# Patient Record
Sex: Female | Born: 2000 | Race: Black or African American | Hispanic: No | Smoking: Never smoker
Health system: Southern US, Community
[De-identification: ages and names within clinical notes are randomized; demographics above are authoritative.]

## PROBLEM LIST (undated history)

## (undated) DIAGNOSIS — E032 Hypothyroidism due to medicaments and other exogenous substances: Secondary | ICD-10-CM

## (undated) DIAGNOSIS — R6250 Unspecified lack of expected normal physiological development in childhood: Secondary | ICD-10-CM

## (undated) DIAGNOSIS — D696 Thrombocytopenia, unspecified: Secondary | ICD-10-CM

## (undated) DIAGNOSIS — E063 Autoimmune thyroiditis: Secondary | ICD-10-CM

## (undated) DIAGNOSIS — E0500 Thyrotoxicosis with diffuse goiter without thyrotoxic crisis or storm: Secondary | ICD-10-CM

## (undated) DIAGNOSIS — S0291XA Unspecified fracture of skull, initial encounter for closed fracture: Secondary | ICD-10-CM

## (undated) HISTORY — DX: Autoimmune thyroiditis: E06.3

## (undated) HISTORY — DX: Thyrotoxicosis with diffuse goiter without thyrotoxic crisis or storm: E05.00

## (undated) HISTORY — DX: Thrombocytopenia, unspecified: D69.6

## (undated) HISTORY — DX: Hypothyroidism due to medicaments and other exogenous substances: E03.2

## (undated) HISTORY — DX: Unspecified lack of expected normal physiological development in childhood: R62.50

## (undated) HISTORY — DX: Unspecified fracture of skull, initial encounter for closed fracture: S02.91XA

---
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
## 2022-03-19 ENCOUNTER — Encounter (INDEPENDENT_AMBULATORY_CARE_PROVIDER_SITE_OTHER): Payer: Self-pay | Admitting: Pediatric Endocrinology

## 2022-03-19 ENCOUNTER — Ambulatory Visit (INDEPENDENT_AMBULATORY_CARE_PROVIDER_SITE_OTHER): Payer: Medicaid Other | Admitting: Pediatric Endocrinology

## 2022-03-19 VITALS — BP 118/80 | HR 72 | Ht 62.21 in | Wt 134.4 lb

## 2022-03-19 DIAGNOSIS — E890 Postprocedural hypothyroidism: Secondary | ICD-10-CM

## 2022-03-19 DIAGNOSIS — F5082 Avoidant/restrictive food intake disorder: Secondary | ICD-10-CM | POA: Diagnosis not present

## 2022-03-19 NOTE — Patient Instructions (Signed)
?  Take your thyroid medication every day.  ?If you forget to take your medication- take it as soon as you remember that you missed it! ?You CAN take 2 pills together at the same time.  ? ?Eat on a schedule EVERY DAY! It doesn't have to be a lot of food- but you do need to put something into your body.  ? ?Breakfast ?Morning snack ?Lunch ?Afternoon snack ?Dinner ? ?

## 2022-03-19 NOTE — Progress Notes (Signed)
Subjective:  ?Patient Name: Valerie [name] Date of Birth: Dec 07, 2001  MRN: [id number] ? ?Valerie [name]  presents to the office today for follow-up evaluation and management  of her hypothyroidism following thyroidectomy in January 2013 ? ?HISTORY OF PRESENT ILLNESS:  ? ?Valerie [name] is a 21 y.o. AA female . ? ?Valerie [name] was accompanied by her girlfriend ? ?1. Valerie [name] has been followed in this clinic since 03/16/07.  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 At times she was taking up to four 5 mg tablets of methimazole, twice daily. At other times we had to stop the methimazole completely.  Her TSI values ranged from 200-564% of baseline, with normal being less than 120-140. In the spring and summer of 2012 she became hypothyroid for several months and we had to treat her with Synthroid. Her family chose to seek definitive therapy. She had surgery on January 17th 2013 for removal of her thyroid gland at [facility]. Post operatively she did not have any problems with calcium metabolism, no changes in her voice, or trouble swallowing. She did become rapidly and profoundly hypothyroid. She was placed on escalating doses of synthroid trying to get her thyroid hormone levels in range.   ? ?2. The patient's last Pediatric endocrine visit was on 03/19/21. In the interim, she has been generally healthy.    ? ?She has continued on 88 mcg of levothyroxine. She is not super consistent with taking her medication. She is sometimes taking it in the morning and sometimes at night. She has been missing some doses. She feels that she has been doing well with taking it since her last refill. She last had it refilled about 5 days ago. She feels that the longest she has gone without it was 3-4 days. She did not feel any different when she wasn't taking it.  ? ?She is working at [employer]. She is not sure yet what she wants to do after high school.  ? ?Hair is short now so she can't tell if it is falling out.   ?She is always tired ?No issues with constipation or diarrhea ?She is always cold.  ?LMP 3/8. (Just spotting). ? ?She has lost a lot of weight since last year. She reports that she is "just not hungry anymore". She says that when she eats she only eats a few bites and then she doesn't want anymore. Her GF says that it is hard to get her to eat.  ? ?3. Pertinent Review of Systems:  ? ?Constitutional: The patient feels "okay". The patient seems healthy and active.  ?Eyes: Vision seems to be good. There are no recognized eye problems. Wears glasses - left them at home. She doesn't think that she needs them.  ?Neck: There are no recognized problems of the anterior neck.  ?Heart: There are no recognized heart problems. The ability to play and do other physical activities seems normal.  ?Lungs: no asthma or wheezing.  ?Gastrointestinal: Bowel movents seem normal. There are no recognized GI problems. ?Legs: Muscle mass and strength seem normal. The child can play and perform other physical activities without obvious discomfort. No edema is noted.  ?Feet: There are no obvious foot problems. No edema is noted. ?Neurologic: There are no recognized problems with muscle movement and strength, sensation, or coordination. ?GYN: Menarche 12/15 (age 85). She is having some periods and some months she just spots. LMP 3/8 ?Skin: some acne on forehead or eczema- improved ? ?PAST MEDICAL, FAMILY, AND SOCIAL HISTORY ? ?  Past Medical History:  ?Diagnosis Date  ? Graves disease   ? Hypothyroidism, iatrogenic   ? Linear skull fracture (HCC) 12/10/2009  ? Physical growth delay   ? Thrombocytopenia (HCC)   ? Thyroiditis, autoimmune   ? 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 Thyrotoxicosis with diffuse goiter   ? ? ?Family History  ?Problem Relation Age of Onset  ? Thyroid disease Father   ? Diabetes Maternal Grandfather   ? Cancer Neg Hx   ? ? ? ?Current Outpatient Medications:  ?  levothyroxine (SYNTHROID) 88 MCG tablet, TAKE 1 TABLET(88 MCG)  BY MOUTH DAILY, Disp: 30 tablet, Rfl: 11 ?  ibuprofen (ADVIL,MOTRIN) 600 MG tablet, Take 1 tab PO Q6H x 1-2 days then Q6H prn pain (Patient not taking: Reported on 08/27/2018), Disp: 30 tablet, Rfl: 0 ? ?Allergies as of 03/19/2022 - Review Complete 03/19/2022  ?Allergen Reaction Noted  ? Penicillins Swelling 10/29/2011  ? ? ? reports that she has never smoked. She has never used smokeless tobacco. She reports that she does not drink alcohol and does not use drugs. ?Pediatric History  ?Patient Parents  ? Capron,Tiffany (Mother)  ? ?Other Topics Concern  ? Not on file  ?Social History Narrative  ?   ? Lives with parents, 2 brothers and 1 sister. Older brother not living at home.   ? In the 11th grade at the [employer] at [employer]. Grades are good.  ? Enjoys writing and music.  ?   ?   ?   ?   ? ?12th grade at [employer]. Lives with mom and siblings.  ?Working at [employer]  ? ? ?Primary Care Provider: Theadore Nan, MD ? ?ROS: There are no other significant problems involving Valerie [name]'s other body systems. ? ? Objective:  ?Vital Signs: ? ?BP 118/80 (BP Location: Left Arm, Patient Position: Sitting, Cuff Size: Large)   Pulse 72   Ht 5' 2.21" (1.58 m)   Wt 134 lb 6.4 oz (61 kg)   LMP 01/21/2022 (Exact Date)   BMI 24.42 kg/m?  ? Blood pressure percentiles are not available for patients who are 18 years or older. ? ? ?Ht Readings from Last 3 Encounters:  ?03/19/22 5' 2.21" (1.58 m) (21 %, Z= -0.80)*  ?03/19/21 5' 2.6" (1.59 m) (27 %, Z= -0.62)*  ?09/14/20 5' 3.47" (1.612 m) (40 %, Z= -0.26)*  ? ?* Growth percentiles are based on CDC (Girls, 2-20 Years) data.  ? ?Wt Readings from Last 3 Encounters:  ?03/19/22 134 lb 6.4 oz (61 kg) (66 %, Z= 0.42)*  ?03/19/21 167 lb 6.4 oz (75.9 kg) (93 %, Z= 1.47)*  ?09/14/20 171 lb 12.8 oz (77.9 kg) (94 %, Z= 1.59)*  ? ?* Growth percentiles are based on CDC (Girls, 2-20 Years) data.  ? ?HC Readings from Last 3 Encounters:  ?No data found for [facility]  ? ?Body surface area is 1.64 meters  squared. ? ?21 %ile (Z= -0.80) based on CDC (Girls, 2-20 Years) Stature-for-age data based on Stature recorded on 03/19/2022. ?66 %ile (Z= 0.42) based on CDC (Girls, 2-20 Years) weight-for-age data using vitals from 03/19/2022. ?No head circumference on file for this encounter. ? ? ?PHYSICAL EXAM:  ? ?Constitutional: The patient appears healthy and well nourished. The patient's height and weight are normal for age.   Weight is -33 pounds since last visit. Marland Kitchen  ?Head: The head is normocephalic. ?Face: The face appears normal. There are no obvious dysmorphic features. ?Eyes: The eyes appear to be normally formed and spaced. Gaze  is conjugate. There is no obvious arcus or proptosis. Moisture appears normal. ?Ears: The ears are normally placed and appear externally normal. ?Mouth: The oropharynx and tongue appear normal. Dentition appears to be normal for age. Oral moisture is normal. ?Neck: The neck appears to be visibly normal.  ?Lungs: no increased work of breathing ?Heart: regular pulses and peripheral perfusion ?Abdomen: The abdomen appears to be large in size for the patient's age. There is no obvious hepatomegaly, splenomegaly, or other mass effect.  ?Arms: Muscle size and bulk are normal for age. Scarring on left arm.  ?Hands: There is no obvious tremor. Phalangeal and metacarpophalangeal joints are normal. Palmar muscles are normal for age. Palmar skin is normal. Palmar moisture is also normal. ?Legs: Muscles appear normal for age. No edema is present. ?Feet: Feet are normally formed. Dorsalis pedal pulses are normal. ?Neurologic: Strength is normal for age in both the upper and lower extremities. Muscle tone is normal. Sensation to touch is normal in both the legs and feet.   ?Skin: well healed surgical scar anterior neck. Multiple tattoos.  ? ?LAB DATA:  ? ?Office Visit on 03/19/2021  ?Component Date Value Ref Range Status  ? TSH 03/19/2021 11.09 (H)  mIU/L Final  ? Comment:            Reference Range ?. ?            1-19 Years 0.50-4.30 ?Marland Kitchen. ?               Pregnancy Ranges ?           First trimester   0.26-2.66 ?           Second trimester  0.55-2.73 ?           Third trimester   0.43-2.91 ?  ? Free T4 03/19/2021 1.2  0.8 - 1

## 2022-04-02 ENCOUNTER — Ambulatory Visit (INDEPENDENT_AMBULATORY_CARE_PROVIDER_SITE_OTHER): Payer: Self-pay | Admitting: Pediatric Endocrinology

## 2022-04-15 ENCOUNTER — Ambulatory Visit (INDEPENDENT_AMBULATORY_CARE_PROVIDER_SITE_OTHER): Payer: Medicaid Other | Admitting: Pediatric Endocrinology

## 2022-04-15 ENCOUNTER — Encounter (INDEPENDENT_AMBULATORY_CARE_PROVIDER_SITE_OTHER): Payer: Self-pay | Admitting: Pediatric Endocrinology

## 2022-04-15 VITALS — BP 100/70 | HR 64 | Ht 62.6 in | Wt 134.6 lb

## 2022-04-15 DIAGNOSIS — E890 Postprocedural hypothyroidism: Secondary | ICD-10-CM | POA: Diagnosis not present

## 2022-04-15 MED ORDER — NORETHINDRONE ACETATE 5 MG PO TABS: 5.0000 mg | ORAL_TABLET | Freq: Every day | ORAL | 3 refills | Status: DC

## 2022-04-15 NOTE — Patient Instructions (Signed)
?  Take your thyroid medication every day.  ?If you forget to take your medication- take it as soon as you remember that you missed it! ?You CAN take 2 pills together at the same time.  ? ?Eat on a schedule EVERY DAY! It doesn't have to be a lot of food- but you do need to put something into your body.  ? ?Breakfast ?Morning snack ?Lunch ?Afternoon snack ?Dinner ? ? ?Start Norethindrone 1 tab daily. If you have spotting you can take 2 pills. If you have a period you can take 3 pills. Once the bleeding stops please resume 1 tab daily.  ? ? ?

## 2022-04-15 NOTE — Progress Notes (Signed)
Subjective:  ?Patient Name: Valerie [name] Date of Birth: 2001-08-29  MRN: [id number] ? ?Valerie [name]  presents to the office today for follow-up evaluation and management  of her hypothyroidism following thyroidectomy in January 2013 ? ?HISTORY OF PRESENT ILLNESS:  ? ?Valerie [name] is a 21 y.o. AA female . ? ?Valerie [name] was accompanied by her girlfriend, Valerie [name] ? ?1. Valerie [name] has been followed in this clinic since 03/16/07.  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 At times she was taking up to four 5 mg tablets of methimazole, twice daily. At other times we had to stop the methimazole completely.  Her TSI values ranged from 200-564% of baseline, with normal being less than 120-140. In the spring and summer of 2012 she became hypothyroid for several months and we had to treat her with Synthroid. Her family chose to seek definitive therapy. She had surgery on January 17th 2013 [facility]. Post operatively she did not have any problems with calcium metabolism, no changes in her voice, or trouble swallowing. She did become rapidly and profoundly hypothyroid. She was placed on escalating doses of synthroid trying to get her thyroid hormone levels in range.   ? ?2. The patient's last Pediatric endocrine visit was on 03/19/22. In the interim, she has been generally healthy.    ? ?She reports good adherence to her levothyroxine over the past month. She says that she doesn't really feel any different taking it than she did when she wasn't taking it.  ? ?Her GF says that she is still tired all the time.  ?She goes to sleep between 10 and 1. She gets up around 8 am.  ? ?She is not doing any exercise. She says that she likes to think about starting to work out.  ? ?She is working at [employer]. She is not sure yet what she wants to do after high school.  ? ?Hair is short - currently with significant hair extensions ?She is always tired ?No issues with constipation or  diarrhea ?She is still sometimes cold. Her GF says that she is always cold.  ?LMP 4/4. She has a lot of vomiting with her period. She vomiting 3 times with this past cycle.  ? ?She feels that her appetit is variable. She is trying to eat more times per day.  ?Valerie [name] says that she is eating more but she is still having to force her. She is still struggling with eating a full meal.  ? ? ?3. Pertinent Review of Systems:  ? ?Constitutional: The patient feels "okay". The patient seems healthy and active.  ?Eyes: Vision seems to be good. There are no recognized eye problems. Wears glasses - left them at home. She doesn't think that she needs them.  ?Neck: There are no recognized problems of the anterior neck.  ?Heart: There are no recognized heart problems. The ability to play and do other physical activities seems normal.  ?Lungs: no asthma or wheezing.  ?Gastrointestinal: Bowel movents seem normal. There are no recognized GI problems. ?Legs: Muscle mass and strength seem normal. The child can play and perform other physical activities without obvious discomfort. No edema is noted.  ?Feet: There are no obvious foot problems. No edema is noted. ?Neurologic: There are no recognized problems with muscle movement and strength, sensation, or coordination. ?GYN: Menarche 12/15 (age 21). LMP- started today.  ?Skin: some acne on forehead or eczema- improved ? ?PAST MEDICAL, FAMILY, AND SOCIAL HISTORY ? ?  Past Medical History:  ?Diagnosis Date  ? Graves disease   ? Hypothyroidism, iatrogenic   ? Linear skull fracture (HCC) 12/10/2009  ? Physical growth delay   ? Thrombocytopenia (HCC)   ? Thyroiditis, autoimmune   ? 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 Thyrotoxicosis with diffuse goiter   ? ? ?Family History  ?Problem Relation Age of Onset  ? Thyroid disease Father   ? Diabetes Maternal Grandfather   ? Cancer Neg Hx   ? ? ? ?Current Outpatient Medications:  ?  levothyroxine (SYNTHROID) 88 MCG tablet, TAKE 1 TABLET(88 MCG) BY MOUTH  DAILY, Disp: 30 tablet, Rfl: 11 ?  norethindrone (AYGESTIN) 5 MG tablet, Take 1 tablet (5 mg total) by mouth daily., Disp: 90 tablet, Rfl: 3 ?  ibuprofen (ADVIL,MOTRIN) 600 MG tablet, Take 1 tab PO Q6H x 1-2 days then Q6H prn pain (Patient not taking: Reported on 08/27/2018), Disp: 30 tablet, Rfl: 0 ? ?Allergies as of 04/15/2022 - Review Complete 04/15/2022  ?Allergen Reaction Noted  ? Penicillins Swelling 10/29/2011  ? ? ? reports that she has never smoked. She has never used smokeless tobacco. She reports that she does not drink alcohol and does not use drugs. ?Pediatric History  ?Patient Parents  ? [name],Valerie (Mother)  ? ?Other Topics Concern  ? Not on file  ?Social History Narrative  ?   ? Lives with parents, 2 brothers and 1 sister. Older brother not living at home.   ? In the 11th grade at the [employer]. Grades are good.  ? Enjoys writing and music.  ?   ?   ?   ?   ? ?12th grade at [employer]. Lives with mom and siblings.  ?Working at [employer]  ? ? ?Primary Care Provider: Theadore Nan, MD ? ?ROS: There are no other significant problems involving Legaci's other body systems. ? ? Objective:  ?Vital Signs: ? ?BP 100/70 (BP Location: Left Arm, Patient Position: Sitting, Cuff Size: Large)   Pulse 64   Ht 5' 2.6" (1.59 m)   Wt 134 lb 9.6 oz (61.1 kg)   LMP 04/15/2022 (Exact Date)   BMI 24.15 kg/m?  ? Blood pressure percentiles are not available for patients who are 18 years or older. ? ? ?Ht Readings from Last 3 Encounters:  ?04/15/22 5' 2.6" (1.59 m) (26 %, Z= -0.65)*  ?03/19/22 5' 2.21" (1.58 m) (21 %, Z= -0.80)*  ?03/19/21 5' 2.6" (1.59 m) (27 %, Z= -0.62)*  ? ?* Growth percentiles are based on CDC (Girls, 2-20 Years) data.  ? ?Wt Readings from Last 3 Encounters:  ?04/15/22 134 lb 9.6 oz (61.1 kg) (66 %, Z= 0.42)*  ?03/19/22 134 lb 6.4 oz (61 kg) (66 %, Z= 0.42)*  ?03/19/21 167 lb 6.4 oz (75.9 kg) (93 %, Z= 1.47)*  ? ?* Growth percentiles are based on CDC (Girls, 2-20 Years) data.  ? ?HC  Readings from Last 3 Encounters:  ?No data found for [facility]  ? ?Body surface area is 1.64 meters squared. ? ?26 %ile (Z= -0.65) based on CDC (Girls, 2-20 Years) Stature-for-age data based on Stature recorded on 04/15/2022. ?66 %ile (Z= 0.42) based on CDC (Girls, 2-20 Years) weight-for-age data using vitals from 04/15/2022. ?No head circumference on file for this encounter. ? ? ?PHYSICAL EXAM:  ? ?Constitutional: The patient appears healthy and well nourished. The patient's height and weight are normal for age.   Weight is stable since last visit. Marland Kitchen  ?Head: The head is normocephalic. ?Face: The  face appears normal. There are no obvious dysmorphic features. ?Eyes: The eyes appear to be normally formed and spaced. Gaze is conjugate. There is no obvious arcus or proptosis. Moisture appears normal. ?Ears: The ears are normally placed and appear externally normal. ?Mouth: The oropharynx and tongue appear normal. Dentition appears to be normal for age. Oral moisture is normal. ?Neck: The neck appears to be visibly normal.  ?Lungs: no increased work of breathing ?Heart: regular pulses and peripheral perfusion ?Abdomen: The abdomen appears to be large in size for the patient's age. There is no obvious hepatomegaly, splenomegaly, or other mass effect.  ?Arms: Muscle size and bulk are normal for age. Scarring on left arm.  ?Hands: There is no obvious tremor. Phalangeal and metacarpophalangeal joints are normal. Palmar muscles are normal for age. Palmar skin is normal. Palmar moisture is also normal. ?Legs: Muscles appear normal for age. No edema is present. ?Feet: Feet are normally formed. Dorsalis pedal pulses are normal. ?Neurologic: Strength is normal for age in both the upper and lower extremities. Muscle tone is normal. Sensation to touch is normal in both the legs and feet.   ?Skin: well healed surgical scar anterior neck. Multiple tattoos.  ? ?LAB DATA:   ? ?Office Visit on 03/19/2021  ?Component Date Value Ref Range Status  ?  TSH 03/19/2021 11.09 (H)  mIU/L Final  ? Comment:            Reference Range ?. ?           1-19 Years 0.50-4.30 ?Marland Kitchen ?               Pregnancy Ranges ?           First trimester   0.26-2.66 ?           [place]

## 2022-04-16 LAB — T4, FREE: Free T4: 1 ng/dL (ref 0.8–1.4)

## 2022-04-16 LAB — TSH: TSH: 9.38 mIU/L — ABNORMAL HIGH

## 2022-04-29 ENCOUNTER — Ambulatory Visit (INDEPENDENT_AMBULATORY_CARE_PROVIDER_SITE_OTHER): Payer: Medicaid Other | Admitting: Pediatric Endocrinology

## 2022-05-16 ENCOUNTER — Ambulatory Visit (INDEPENDENT_AMBULATORY_CARE_PROVIDER_SITE_OTHER): Payer: Medicaid Other | Admitting: Pediatric Endocrinology

## 2022-05-16 ENCOUNTER — Encounter (INDEPENDENT_AMBULATORY_CARE_PROVIDER_SITE_OTHER): Payer: Self-pay | Admitting: Pediatric Endocrinology

## 2022-05-16 VITALS — BP 118/80 | HR 64 | Ht 62.95 in | Wt 127.8 lb

## 2022-05-16 DIAGNOSIS — R634 Abnormal weight loss: Secondary | ICD-10-CM

## 2022-05-16 DIAGNOSIS — E890 Postprocedural hypothyroidism: Secondary | ICD-10-CM

## 2022-05-16 LAB — CBC WITH DIFFERENTIAL/PLATELET
Absolute Monocytes: 281 cells/uL (ref 200–900)
Basophils Absolute: 9 cells/uL (ref 0–200)
HCT: 30.6 % — ABNORMAL LOW (ref 34.0–46.0)
Hemoglobin: 8.9 g/dL — ABNORMAL LOW (ref 11.5–15.3)
Neutro Abs: 1215 cells/uL — ABNORMAL LOW (ref 1800–8000)

## 2022-05-16 NOTE — Progress Notes (Signed)
Subjective:  Patient Name: Valerie [name] Date of Birth: 08/10/2001  MRN: [id number]  Valerie [name]  presents to the office today for follow-up evaluation and management  of her hypothyroidism following thyroidectomy in January 2013  HISTORY OF PRESENT ILLNESS:   Valerie [name] is a 21 y.o. AA female .  Valerie [name] was accompanied by her girlfriend, Valerie [name]   1. Valerie [name] has been followed in this clinic since 03/16/07.  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 At times she was taking up to four 5 mg tablets of methimazole, twice daily. At other times we had to stop the methimazole completely.  Her TSI values ranged from 200-564% of baseline, with normal being less than 120-140. In the spring and summer of 2012 she became hypothyroid for several months and we had to treat her with Synthroid. Her family chose to seek definitive therapy. She had surgery on January 17th 2013 for removal of her thyroid gland at [facility]. Post operatively she did not have any problems with calcium metabolism, no changes in her voice, or trouble swallowing. She did become rapidly and profoundly hypothyroid. She was placed on escalating doses of synthroid trying to get her thyroid hormone levels in range.    2. The patient's last Pediatric endocrine visit was on 04/15/22. In the interim, she has been generally healthy.     She has continued to do well with taking her medication.   She continues to be sleepy all the time.   She is not exercising.   She is working at [employer]. She has graduated HS.    Hair is short - currently with significant hair extensions- no issues She is always tired No issues with constipation or diarrhea She is no longer always cold.  LMP 5/25. She had 1 episode of vomiting earlier in the month but none with her period.   She is still not that hungry and doesn't eat that much. Valerie [name] says that she is still having to make her eat. She is still struggling with eating a  full meal. She usually eats about 5 bites.    3. Pertinent Review of Systems:   Constitutional: The patient feels "fine". The patient seems healthy and active.  Eyes: Vision seems to be good. There are no recognized eye problems. Wears glasses - left them at home. She doesn't think that she needs them.  Neck: There are no recognized problems of the anterior neck.  Heart: There are no recognized heart problems. The ability to play and do other physical activities seems normal.  Lungs: no asthma or wheezing.  Gastrointestinal: Bowel movents seem normal. There are no recognized GI problems. Legs: Muscle mass and strength seem normal. The child can play and perform other physical activities without obvious discomfort. No edema is noted.  Feet: There are no obvious foot problems. No edema is noted. Neurologic: There are no recognized problems with muscle movement and strength, sensation, or coordination. GYN: Menarche 12/15 (age 31). 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 (Patient not taking: Reported on 05/16/2022), Disp: 90 tablet, Rfl:  3  Allergies as of 05/16/2022 - Review Complete 05/16/2022  Allergen Reaction Noted   Penicillins Swelling 10/29/2011     reports that she has never smoked. She has never used smokeless tobacco. She reports that she does not drink alcohol and does not use drugs. Pediatric History  Patient Parents   Wherley,Tiffany (Mother)   Other Topics Concern   Not on file  Social History Narrative      Lives with parents, 2 brothers and 1 sister. Older brother not living at home.    In the 11th grade at the [employer]. Grades are good.   Enjoys writing and music.               Graduated [employer]. Lives with mom and siblings.  Working at [employer]    Primary Care Provider: Theadore Nan, MD  ROS: There are no other significant problems involving Lezli's other body systems.   Objective:  Vital Signs:   BP 118/80 (BP Location: Left Arm, Patient Position: Sitting, Cuff Size: Large)   Pulse 64   Ht 5' 2.95" (1.599 m)   Wt 127 lb 12.8 oz (58 kg)   LMP 05/09/2022   BMI 22.67 kg/m   Blood pressure percentiles are not available for patients who are 18 years or older.   Ht Readings from Last 3 Encounters:  05/16/22 5' 2.95" (1.599 m) (30 %, Z= -0.51)*  04/15/22 5' 2.6" (1.59 m) (26 %, Z= -0.65)*  03/19/22 5' 2.21" (1.58 m) (21 %, Z= -0.80)*   * Growth percentiles are based on CDC (Girls, 2-20 Years) data.   Wt Readings from Last 3 Encounters:  05/16/22 127 lb 12.8 oz (58 kg) (55 %, Z= 0.12)*  04/15/22 134 lb 9.6 oz (61.1 kg) (66 %, Z= 0.42)*  03/19/22 134 lb 6.4 oz (61 kg) (66 %, Z= 0.42)*   * Growth percentiles are based on CDC (Girls, 2-20 Years) data.   HC Readings from Last 3 Encounters:  No data found for [facility]   Body surface area is 1.61 meters squared.  30 %ile (Z= -0.51) based on CDC (Girls, 2-20 Years) Stature-for-age data based on Stature recorded on 05/16/2022. 55 %ile (Z= 0.12) based on CDC (Girls, 2-20 Years) weight-for-age data using vitals from  05/16/2022. No head circumference on file for this encounter.   PHYSICAL EXAM:   Constitutional: The patient appears healthy and well nourished. The patient's height and weight are normal for age.   Weight is stable since last visit. Marland Kitchen  Head: The head is normocephalic. Face: The face appears normal. There are no obvious dysmorphic features. Eyes: The eyes appear to be normally formed and spaced. Gaze is conjugate. There is no obvious arcus or proptosis. Moisture appears normal. Ears: The ears are normally placed and appear externally normal. Mouth: The oropharynx and tongue appear normal. Dentition appears to be normal for age. Oral moisture is normal. Neck: The neck appears to be visibly normal.  Lungs: no increased work of breathing Heart: regular pulses and peripheral perfusion Abdomen: The abdomen appears to be large in size for the patient's age. There is no obvious hepatomegaly, splenomegaly, or other mass effect.  Arms: Muscle size and bulk are normal for age. Scarring on left arm.  Hands: There is no obvious tremor. Phalangeal and metacarpophalangeal joints are normal. Palmar muscles are normal for age. Palmar skin is normal. Palmar moisture is also normal. Legs: Muscles appear normal for age. No edema is present. Feet: Feet are normally formed. Dorsalis pedal pulses are normal. Neurologic: Strength is normal for age in both the upper and lower extremities. Muscle tone is normal. Sensation to touch is normal in both the legs and feet.   Skin: well healed surgical scar anterior neck. Multiple tattoos.   LAB DATA:    Office Visit on 04/15/2022  Component Date Value Ref Range Status   TSH 04/15/2022 9.38 (H)  mIU/L Final   Comment:            Reference Range .            1-19 Years 0.50-4.30 .                Pregnancy Ranges            First trimester   0.26-2.66            Second trimester  0.55-2.73            Third trimester   0.43-2.91    Free T4 04/15/2022 1.0  0.8 - 1.4  ng/dL Final    Lab Results  Component Value Date   TSH 9.38 (H) 04/15/2022   TSH 11.09 (H) 03/19/2021   TSH 5.45 (H) 09/14/2020   TSH 0.53 06/12/2020   TSH 0.24 (L) 03/09/2020   TSH 1.54 07/05/2019   Lab Results  Component Value Date   FREET4 1.0 04/15/2022   FREET4 1.2 03/19/2021   FREET4 1.1 09/14/2020   FREET4 1.6 (H) 06/12/2020   FREET4 1.5 (H) 03/09/2020   FREET4 1.1 07/05/2019    Pending    Assessment and Plan:   ASSESSMENT: Deneshia is a 21 y.o. AA female with history of difficult to manage Grave's Disease/hyperthyroidism treated with thyroidectomy at age 23 for definitive therapy.   Post Surgical Hypothyroid - clinically euthyroid - 88 mcg of Synthroid daily - She reports good adherence for the past month  Significant weight loss - She has lost another 7 pounds since her last visit. This is a total of ~25% body weight loss in the past 13 months.  - appetite is still minimal  PLAN:    1. Diagnostic:  Lab Orders         Comprehensive metabolic panel         Magnesium         CBC with Differential/Platelet         VITAMIN D 25 Hydroxy (Vit-D Deficiency, Fractures)         Phosphorus         Fe+TIBC+Fer         TSH         T4, free      .  2. Therapeutic: Continue Synthroid 88 mcg daily  3. Patient education:discussed strategies for keeping prescriptions up to date. Reviewed need for 5 small meals per day - even if she is not hungry.  Referral placed to adolescent medicine due to ongoing weight loss and concern for ED 4.  Follow-up: Return in about 2 months (around 07/16/2022).    Dessa PhiJennifer Avishai Reihl, MD  Level of Service: >30 minutes spent today reviewing the medical chart, counseling the patient/family, and documenting today's encounter.

## 2022-05-16 NOTE — Patient Instructions (Signed)
What is hypothyroidism?  Hypothyroidism refers to an underactive thyroid gland that does not  produce enough of the active thyroid hormones triiodothyronine (T3) and levothyroxine (T4). This condition can be present at birth or acquired anytime during childhood or adulthood. Hypothyroidism is very common and occurs in about 1 in 1,250 children. In most cases, the condition is permanent and will require treatment for life. This handout focuses on the causes of hypothyroidism in children that arise after birth.The thyroid gland is a butterfly-shaped organ located in the middle  of the neck. It is responsible for producing thyroid hormones T3 and T4. This production is controlled by the pituitary gland in the brain via thyroid-stimulating hormone (TSH). T3 and T4 perform many important  actions during childhood, including the maintenance of normal growth and bone development. Thyroid hormone is also important in the regulation of metabolism. What causes acquired hypothyroidism?  The causes of hypothyroidism can arise from the gland itself or from the pituitary. The thyroid can be damaged by direct antibody attack (autoimmunity), radiation, or surgery. The pituitary gland can be damaged following a severe brain injury or secondary to radiation treatment. Certain medications and substances can interfere with thyroid hormone production. For example, too much or too little iodine in the diet can lead to hypothyroidism. Overall, the most common cause of hypothyroidism in children and teens is direct attack of the thyroid gland from the immune system. This disease is known as autoimmune thyroiditis or Hashimoto disease. Certain children are at greater risk of hypothyroidism, including those with congenital syndromes, especially Down  syndrome and Turner syndrome; those with type 1 diabetes; and those who have received radiation for cancer treatment.  What are the signs and symptoms of hypothyroidism?  The signs  and symptoms of hypothyroidism include:  Tiredness  Modest weight gain (no more than 5-10 lb)  Feeling cold  Dry skin  Hair loss  Constipation  Poor growth  Often, your child's doctor will be able to palpate an enlarged thyroid  gland in the neck. This is called a goiter. How is hypothyroidism diagnosed?  Simple blood tests are used to diagnose hypothyroidism. These include  the measurement of hormones produced by the thyroid and pituitary  glands. Free T4, total T4, and TSH levels are usually measured. These tests are inexpensive and widely available at your regular doctor's office.Primary hypothyroidism is diagnosed when the level of stimulating hormone from the pituitary gland (TSH) in the blood is high and the free T4 level produced by the thyroid is low. Secondary hypothyroidism occurs if  there is not enough TSH, both levels will be low. Normal ranges for free T4 and TSH are somewhat different in children  than adults, so the diagnosis should be made in consultation with a pediatric endocrinologist.  How is hypothyroidism treated?  Hypothyroidism is treated using a synthetic thyroid hormone called levothyroxine. This is a once-daily pill that is usually given for life (for more information on thyroid hormone, see the Thyroid Hormone Administration: A Guide for Families handout). There are very few side effects, and when they do occur, it is usually the result of significant  overtreatment. Your child's doctor will prescribe the medication and then perform repeat blood testing. The repeat blood testing will not happen for at least 6 to 8 weeks because it takes time for the body to adjust to its new hormone  levels. If the medication is working, blood testing will show normal levels of TSH and free T4. The dose of the   medication is adjusted by regular monitoring of thyroid function laboratory tests. You should contact your child's doctor if your child experiences difficulty  falling  asleep, restless sleep, or difficulty concentrating in school. These may be signs that your child's current thyroid hormone dose may be too high and your child is being overtreated.There is no cure for hypothyroidism; however, hormone replacement  is safe and effective. With once-daily medication and close follow-up with your pediatric endocrinologist, your child can live a normal,  healthy life.   Pediatric Endocrinology Fact Sheet Acquired Hypothyroidism in Children: A Guide for Families Copyright  2018 American Academy of Pediatrics and Pediatric Endocrine Society. All rights reserved. The information contained in this publication should not be used as a substitute for the medical care and advice of your pediatrician. There may be variations in treatment that your pediatrician may recommend based on individual facts and circumstances.Pediatric Endocrine Society/American Academy of Pediatrics Section on Endocrinology Patient Education Committee  

## 2022-05-17 LAB — COMPREHENSIVE METABOLIC PANEL
AG Ratio: 1.6 (calc) (ref 1.0–2.5)
ALT: 5 U/L (ref 5–32)
AST: 11 U/L — ABNORMAL LOW (ref 12–32)
Albumin: 4.6 g/dL (ref 3.6–5.1)
Alkaline phosphatase (APISO): 26 U/L — ABNORMAL LOW (ref 36–128)
BUN: 10 mg/dL (ref 7–20)
CO2: 25 mmol/L (ref 20–32)
Calcium: 9.2 mg/dL (ref 8.9–10.4)
Chloride: 106 mmol/L (ref 98–110)
Creat: 0.62 mg/dL (ref 0.50–0.96)
Globulin: 2.8 g/dL (calc) (ref 2.0–3.8)
Glucose, Bld: 87 mg/dL (ref 65–99)
Potassium: 4.3 mmol/L (ref 3.8–5.1)
Sodium: 139 mmol/L (ref 135–146)
Total Bilirubin: 0.3 mg/dL (ref 0.2–1.1)
Total Protein: 7.4 g/dL (ref 6.3–8.2)

## 2022-05-17 LAB — CBC WITH DIFFERENTIAL/PLATELET
Basophils Relative: 0.3 %
Eosinophils Absolute: 0 cells/uL — ABNORMAL LOW (ref 15–500)
Eosinophils Relative: 0 %
Lymphs Abs: 1395 cells/uL (ref 1200–5200)
MCH: 22.1 pg — ABNORMAL LOW (ref 25.0–35.0)
MCHC: 29.1 g/dL — ABNORMAL LOW (ref 31.0–36.0)
MCV: 76.1 fL — ABNORMAL LOW (ref 78.0–98.0)
MPV: 10.4 fL (ref 7.5–12.5)
Monocytes Relative: 9.7 %
Neutrophils Relative %: 41.9 %
Platelets: 247 10*3/uL (ref 140–400)
RBC: 4.02 10*6/uL (ref 3.80–5.10)
RDW: 18.5 % — ABNORMAL HIGH (ref 11.0–15.0)
Total Lymphocyte: 48.1 %
WBC: 2.9 10*3/uL — ABNORMAL LOW (ref 4.5–13.0)

## 2022-05-17 LAB — MAGNESIUM: Magnesium: 2 mg/dL (ref 1.5–2.5)

## 2022-05-17 LAB — IRON,TIBC AND FERRITIN PANEL
%SAT: 3 % (calc) — ABNORMAL LOW (ref 15–45)
Ferritin: 1 ng/mL — ABNORMAL LOW (ref 6–67)
Iron: 13 ug/dL — ABNORMAL LOW (ref 27–164)
TIBC: 443 mcg/dL (calc) (ref 271–448)

## 2022-05-17 LAB — PHOSPHORUS: Phosphorus: 4.4 mg/dL (ref 3.0–5.1)

## 2022-05-17 LAB — T4, FREE: Free T4: 1.3 ng/dL (ref 0.8–1.4)

## 2022-05-17 LAB — TSH: TSH: 11.18 mIU/L — ABNORMAL HIGH

## 2022-05-17 LAB — VITAMIN D 25 HYDROXY (VIT D DEFICIENCY, FRACTURES): Vit D, 25-Hydroxy: 16 ng/mL — ABNORMAL LOW (ref 30–100)

## 2022-05-30 ENCOUNTER — Other Ambulatory Visit (INDEPENDENT_AMBULATORY_CARE_PROVIDER_SITE_OTHER): Payer: Self-pay | Admitting: Pediatric Endocrinology

## 2022-05-30 DIAGNOSIS — F5082 Avoidant/restrictive food intake disorder: Secondary | ICD-10-CM

## 2022-05-30 DIAGNOSIS — R790 Abnormal level of blood mineral: Secondary | ICD-10-CM

## 2022-05-30 DIAGNOSIS — D709 Neutropenia, unspecified: Secondary | ICD-10-CM

## 2022-05-30 DIAGNOSIS — D508 Other iron deficiency anemias: Secondary | ICD-10-CM

## 2022-06-06 ENCOUNTER — Encounter: Payer: Self-pay | Admitting: Family

## 2022-06-06 ENCOUNTER — Encounter: Payer: Medicaid Other | Admitting: Licensed Clinical Social Worker

## 2022-06-06 ENCOUNTER — Ambulatory Visit (INDEPENDENT_AMBULATORY_CARE_PROVIDER_SITE_OTHER): Payer: Medicaid Other | Admitting: Family

## 2022-06-06 VITALS — BP 115/77 | HR 73 | Ht 62.21 in | Wt 128.6 lb

## 2022-06-06 DIAGNOSIS — N946 Dysmenorrhea, unspecified: Secondary | ICD-10-CM | POA: Diagnosis not present

## 2022-06-06 DIAGNOSIS — R634 Abnormal weight loss: Secondary | ICD-10-CM | POA: Diagnosis not present

## 2022-06-06 DIAGNOSIS — Z113 Encounter for screening for infections with a predominantly sexual mode of transmission: Secondary | ICD-10-CM | POA: Diagnosis not present

## 2022-06-06 DIAGNOSIS — F4321 Adjustment disorder with depressed mood: Secondary | ICD-10-CM

## 2022-06-06 DIAGNOSIS — Z3202 Encounter for pregnancy test, result negative: Secondary | ICD-10-CM

## 2022-06-06 LAB — POCT URINE PREGNANCY: Preg Test, Ur: NEGATIVE

## 2022-06-06 MED ORDER — NORETHINDRONE ACETATE 5 MG PO TABS: 5.0000 mg | ORAL_TABLET | Freq: Every day | ORAL | 0 refills | Status: AC

## 2022-06-06 NOTE — Progress Notes (Addendum)
THIS RECORD MAY CONTAIN CONFIDENTIAL INFORMATION THAT SHOULD NOT BE RELEASED WITHOUT REVIEW OF THE SERVICE PROVIDER.  Adolescent Medicine Consultation Initial Visit Valerie [name]  is a 21 y.o. female referred by Valerie [name] here today for evaluation of weight loss.  Supervising Physician: Valerie [name]    Review of records?  yes  Pertinent Labs? No  Growth Chart Viewed? yes   History was provided by the patient.   Chief complaint: Weight loss  HPI:   PCP Confirmed?  yes   Referred by: Valerie [name]  - Patient reports unintentional weight loss over the past year.  - She says that she did not notice she was losing weight but friends and family have commented on it - She states it is due to not feeling like eating - She eats 1-2 meals per day. They are typically small meals. If she eats more she either vomits or has bad stomach pains - Yesterday she ate a breakfast croissant and a baked potato and felt full. - Denies any purging or intentional weight loss. She is unsure if she wants to go back to eating normally though. - When she's very stressed she does not eat but never skips days without eating anything - Prior to this she reports eating a lot, typically 3 meals - She thinks it all began at the beginning of her senior year (just graduated) when she entered relationship with two other women - She states she has had a lot of stress over the past year and that relationship was a large part of it - She thinks her stress is causing her to not be able to eat - She now is in a relationship with only one of the women and states that its not a good relationship. The other person blames her for a lot of things.  - She was skipping school and having a lot of difficulties and was not sure she would graduate. She regained her focus and successfully graduated - She now works at [employer]   - She has been feeling sad and down recently but denies anxious feeling - Does not have a support system to  talk to - Spends a lot of time by herself in her room - Denies SI/HI - No therapist and not on medications in the past   - When on period if eats she vomits and has bad stomach pains - Valerie [name] prescribed birth control for painful periods but went to wrong pharmacy so she has not started.  - Uses about 5 pads and 2 tampons per day. Some clots. Bleeds for 7 days. - LMP May 25  - Taking synthroid regularly - TSH 11.18 at endo visit on 6/1   Patient's last menstrual period was 05/09/2022.  Allergies  Allergen Reactions   Penicillins Swelling   Current Outpatient Medications on File Prior to Visit  Medication Sig Dispense Refill   levothyroxine (SYNTHROID) 88 MCG tablet TAKE 1 TABLET(88 MCG) BY MOUTH DAILY 30 tablet 11   ibuprofen (ADVIL,MOTRIN) 600 MG tablet Take 1 tab PO Q6H x 1-2 days then Q6H prn pain (Patient not taking: Reported on 08/27/2018) 30 tablet 0   norethindrone (AYGESTIN) 5 MG tablet Take 1 tablet (5 mg total) by mouth daily. (Patient not taking: Reported on 05/16/2022) 90 tablet 3   No current facility-administered medications on file prior to visit.    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 yes Family History  Problem Relation Age of Onset   Thyroid disease Father     Diabetes Maternal Grandfather    Cancer Neg Hx     Social History:  School:  School: just graduated 12th grade Difficulties at school:  yes, almost did not graduate but did  Lifestyle habits that can impact QOL: Sleep: sleeps poorly, same dreams over and over again  Confidentiality was discussed with the patient and if applicable, with caregiver as well.  Gender identity: Female Sex assigned at birth: Female Pronouns: she Tobacco?  no Partner preference?  female  Sexually Active?  yes  The following portions of the patient's history were reviewed and updated as appropriate: allergies, current medications, past family history, past medical history, past social history, past surgical history, and problem list.      06/06/2022   10:42 AM  PHQ-SADS Last 3 Score only  PHQ-15 Score 5  Total GAD-7 Score 4  PHQ Adolescent Score 13   EAT 26 Completed on 06/06/22 Total Score: 6 Patient report of Weight: Highest: Blank  Lowest: 128 Ideal: blank Binge: No Purge: No Over-Exercise: No  Physical Exam:  Vitals:   06/06/22 0926 06/06/22 0937  BP: 117/78 115/77  Pulse: 65 73  Weight: 128 lb 9.6 oz (58.3 kg)   Height: 5' 2.21" (1.58 m)    BP 115/77 (BP Location: Right Arm, Cuff Size: Normal)   Pulse 73   Ht 5' 2.21" (1.58 m)   Wt 128 lb 9.6 oz (58.3 kg)   LMP 05/09/2022   BMI 23.37 kg/m  Body mass index: body mass index is 23.37 kg/m. Blood pressure %iles are not available for patients who are 18 years or older.   Physical Exam Constitutional:      General: She is not in acute distress.    Appearance: She is well-developed.     Comments: Tearful on exam  HENT:     Head: Normocephalic and atraumatic.  Eyes:     General: No scleral icterus.    Pupils: Pupils are equal, round, and reactive to light.  Neck:     Thyroid: No thyromegaly.  Cardiovascular:     Rate and Rhythm: Normal rate and regular rhythm.     Heart sounds: Normal heart sounds. No murmur  heard. Pulmonary:     Effort: Pulmonary effort is normal.     Breath sounds: Normal breath sounds.  Abdominal:     Palpations: Abdomen is soft.  Musculoskeletal:        General: Normal range of motion.     Cervical back: Normal range of motion and neck supple.  Lymphadenopathy:     Cervical: No cervical adenopathy.  Skin:    General: Skin is warm and dry.     Findings: No rash.  Neurological:     Mental Status: She is alert and oriented to person, place, and  time.     Cranial Nerves: No cranial nerve deficit.  Psychiatric:        Behavior: Behavior normal.        Thought Content: Thought content normal.        Judgment: Judgment normal.       06/06/2022   10:42 AM  PHQ-SADS Last 3 Score only  PHQ-15 Score 5  Total GAD-7 Score 4  PHQ Adolescent Score 13   EAT 26 Completed on 06/06/22 Total Score: 6 Patient report of Weight: Highest: NA Lowest: NA Ideal: NA Binge: No Purge: No Over-Exercise: No   Assessment/Plan: 1. Adjustment disorder with depressed mood 2. Weight loss Review of growth chart indicates 39 lb weight loss in the past 14 months. No frank body image or eating disorder patterns were identified today, denies intentional weight loss, restricting, or purging. She seems as though her mood greatly effects her appetite. She describes being very stressed and is tearful on exam. Describes having little appetite when she is experiencing stress which she believes led to her weight loss. She is overall well appearing with stable vitals today. She is agreeable to meeting with BH at her next visit and is contemplating medication management for her adjustment disorder. We will discuss SSRI options further at her follow up visit. She requested to follow up in 4 weeks.  3. Dysmenorrhea She has history of painful cramps and vomiting during her menstrual cycles for which endocrinology prescribed OCPs. She has not started these yet because she wanted them sent to a different  pharmacy. Prescription has been sent to preferred pharmacy. She also describes using about 7 pads per day during her menstrual cycle. This is likely somewhat due to her hypothyroidism- TSH 11 at last endocrinology visit. If continuing to have heavy bleeding after starting OCP and stable thyroid levels, may need further lab work. -Rx for Aygestin sent to pharmacy    Follow-up:   one month  A copy of this consultation visit was sent to: Theadore Nan, MD, Theadore Nan, MD   Madison Hickman, MD [facility] Pediatric Resident, PGY-3  Supervising Provider Co-Signature  I reviewed with the resident the medical history and the resident's findings on physical examination.  I discussed with the resident the patient's diagnosis and concur with the treatment plan as documented in the resident's note.  Georges Mouse, NP

## 2022-06-07 LAB — C. TRACHOMATIS/N. GONORRHOEAE RNA
C. trachomatis RNA, TMA: NOT DETECTED
N. gonorrhoeae RNA, TMA: NOT DETECTED

## 2022-06-12 ENCOUNTER — Encounter: Payer: Self-pay | Admitting: Family

## 2022-06-26 NOTE — BH Specialist Note (Unsigned)
Integrated Behavioral Health Initial In-Person Visit  MRN: [id number] Name: Valerie [name]  Number of Integrated Behavioral Health Clinician visits: No data recorded Session Start time: No data recorded   Session End time: No data recorded Total time in minutes: No data recorded  Types of Service: {CHL AMB TYPE OF SERVICE:[phone]}  Interpretor:{yes WK:[id number]} Interpretor Name and Language: ***   Warm Hand Off Completed.        Subjective: Valerie [name] is a 21 y.o. female accompanied by {CHL AMB ACCOMPANIED RP:[id number]} Patient was referred by *** for ***. Patient reports the following symptoms/concerns: *** Duration of problem: ***; Severity of problem: {Mild/Moderate/Severe:20260}  Objective: Mood: {BHH MOOD:22306} and Affect: {BHH AFFECT:22307} Risk of harm to self or others: {CHL AMB BH Suicide Current Mental Status:21022748}  Life Context: Family and Social: *** School/Work: *** Self-Care: *** Life Changes: ***  Patient and/or Family's Strengths/Protective Factors: {CHL AMB BH PROTECTIVE FACTORS:[phone]}  Goals Addressed: Patient will: Reduce symptoms of: {IBH Symptoms:21014056} Increase knowledge and/or ability of: {IBH Patient Tools:21014057}  Demonstrate ability to: {IBH Goals:21014053}  Progress towards Goals: {CHL AMB BH PROGRESS TOWARDS GOALS:[phone]}  Interventions: Interventions utilized: {IBH Interventions:21014054}  Standardized Assessments completed: {IBH Screening Tools:21014051}  Patient and/or Family Response: ***  Patient Centered Plan: Patient is on the following Treatment Plan(s):  ***  Assessment: Patient currently experiencing ***.   Patient may benefit from ***.  Plan: Follow up with behavioral health clinician on : *** Behavioral recommendations: *** Referral(s): {IBH Referrals:21014055} "From scale of 1-10, how likely are you to follow plan?": ***  Carleene Overlie, [facility]

## 2022-06-27 ENCOUNTER — Ambulatory Visit (INDEPENDENT_AMBULATORY_CARE_PROVIDER_SITE_OTHER): Payer: Medicaid Other | Admitting: Licensed Clinical Social Worker

## 2022-06-27 DIAGNOSIS — F4321 Adjustment disorder with depressed mood: Secondary | ICD-10-CM | POA: Diagnosis not present

## 2022-07-04 ENCOUNTER — Ambulatory Visit: Payer: Medicaid Other | Admitting: Family

## 2022-07-04 ENCOUNTER — Ambulatory Visit: Payer: Medicaid Other | Admitting: Licensed Clinical Social Worker

## 2022-07-16 ENCOUNTER — Encounter (INDEPENDENT_AMBULATORY_CARE_PROVIDER_SITE_OTHER): Payer: Self-pay | Admitting: Pediatric Endocrinology

## 2022-07-16 ENCOUNTER — Ambulatory Visit (INDEPENDENT_AMBULATORY_CARE_PROVIDER_SITE_OTHER): Payer: Medicaid Other | Admitting: Pediatric Endocrinology

## 2022-07-16 VITALS — BP 122/78 | HR 76 | Ht 62.6 in | Wt 128.6 lb

## 2022-07-16 DIAGNOSIS — D509 Iron deficiency anemia, unspecified: Secondary | ICD-10-CM | POA: Diagnosis not present

## 2022-07-16 DIAGNOSIS — D709 Neutropenia, unspecified: Secondary | ICD-10-CM

## 2022-07-16 DIAGNOSIS — E063 Autoimmune thyroiditis: Secondary | ICD-10-CM | POA: Diagnosis not present

## 2022-07-16 LAB — CBC WITH DIFFERENTIAL/PLATELET
Absolute Monocytes: 310 cells/uL (ref 200–900)
Basophils Absolute: 11 cells/uL (ref 0–200)
Basophils Relative: 0.3 %
Eosinophils Absolute: 0 cells/uL — ABNORMAL LOW (ref 15–500)
Eosinophils Relative: 0 %
HCT: 28.7 % — ABNORMAL LOW (ref 34.0–46.0)
MCH: 21.2 pg — ABNORMAL LOW (ref 25.0–35.0)
MCHC: 28.9 g/dL — ABNORMAL LOW (ref 31.0–36.0)
MPV: 9.7 fL (ref 7.5–12.5)
Monocytes Relative: 8.6 %
Neutro Abs: 1897 cells/uL (ref 1800–8000)
Platelets: 317 10*3/uL (ref 140–400)
Total Lymphocyte: 38.4 %
WBC: 3.6 10*3/uL — ABNORMAL LOW (ref 4.5–13.0)

## 2022-07-16 MED ORDER — LEVOTHYROXINE SODIUM 88 MCG PO TABS: ORAL_TABLET | ORAL | 3 refills | Status: DC

## 2022-07-16 NOTE — Progress Notes (Signed)
Subjective:  Patient Name: Valerie [name] Date of Birth: 07-Jan-2001  MRN: [id number]  Valerie [name]  presents to the office today for follow-up evaluation and management  of her hypothyroidism following thyroidectomy in January 2013  HISTORY OF PRESENT ILLNESS:   Valerie [name] is a 22 y.o. AA female .  Valerie [name] was accompanied by her girlfriend, Valerie [name]   1. Valerie [name] has been followed in this clinic since 03/16/07.  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 At times she was taking up to four 5 mg tablets of methimazole, twice daily. At other times we had to stop the methimazole completely.  Her TSI values ranged from 200-564% of baseline, with normal being less than 120-140. In the spring and summer of 2012 she became hypothyroid for several months and we had to treat her with Synthroid. Her family chose to seek definitive therapy. She had surgery on January 17th 2013 for removal of her thyroid gland at [facility]. Post operatively she did not have any problems with calcium metabolism, no changes in her voice, or trouble swallowing. She did become rapidly and profoundly hypothyroid. She was placed on escalating doses of synthroid trying to get her thyroid hormone levels in range.    2. The patient's last Pediatric endocrine visit was on 05/16/22. In the interim, she has been generally healthy.     She has continued to do well with taking her medication.   At her last visit her blood counts showed that she was neutropenic and anemic. Referral placed to hematology but not scheduled.   She is still sleepy a lot. She has been trying to get more active.   She is (sadly) still working at [employer].    Hair is short - currently with significant hair extensions- no issues She is always tired No issues with constipation or diarrhea She is still frequently cold  LMP 7/20. No vomiting with her menses in July but she did with her menses in June.   She was meant to start OCPs  but it was at the wrong pharmacy and then even after it was transferred to the correct pharmacy she did not start it.   3. Pertinent Review of Systems:   Constitutional: The patient feels "fine". The patient seems healthy and active.  Eyes: Vision seems to be good. There are no recognized eye problems. Wears glasses - left them at home. She doesn't think that she needs them.  Neck: There are no recognized problems of the anterior neck.  Heart: There are no recognized heart problems. The ability to play and do other physical activities seems normal.  Lungs: no asthma or wheezing.  Gastrointestinal: Bowel movents seem normal. There are no recognized GI problems. Legs: Muscle mass and strength seem normal. The child can play and perform other physical activities without obvious discomfort. No edema is noted.  Feet: There are no obvious foot problems. No edema is noted. Neurologic: There are no recognized problems with muscle movement and strength, sensation, or coordination. GYN: Menarche 12/15 (age 22). LMP- 7/20- no vomiting- but she did have vomiting with cycle in June.  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 (Patient not taking: Reported on 07/16/2022), Disp: 90 tablet, Rfl: 0  Allergies as of 07/16/2022 - Review Complete 07/16/2022  Allergen Reaction Noted   Penicillins Swelling 10/29/2011     reports that she has never smoked. She has never used smokeless tobacco. She reports current drug use. Drug: Marijuana. She reports that she does not drink alcohol. Pediatric History  Patient Parents   Valerie [name],Valerie [name] (Mother)   Other Topics Concern   Not on file  Social History Narrative      Lives with parents, 2 brothers and 1 sister. Older brother not living at home.    In the 11th grade at the [employer]. Grades are good.   Enjoys writing and music.               Graduated [employer]. Lives with mom and siblings.  Working at [employer]    Primary Care Provider: Theadore Nan, MD  ROS: There are no other significant problems involving Valerie [name]'s other body systems.   Objective:  Vital Signs:   BP 122/78 (BP Location: Right Arm, Patient Position: Sitting, Cuff Size: Large)   Pulse 76   Ht 5' 2.6" (1.59 m)   Wt 128 lb 9.6 oz (58.3 kg)   LMP 07/04/2022 (Exact Date)   BMI 23.07 kg/m   Blood pressure %iles are not available for patients who are 18 years or older.   Ht Readings from Last 3 Encounters:  07/16/22 5' 2.6" (1.59 m) (26 %, Z= -0.65)*  06/06/22 5' 2.21" (1.58 m) (21 %, Z= -0.80)*  05/16/22 5' 2.95" (1.599 m) (30 %, Z= -0.51)*   * Growth percentiles are based on CDC (Girls, 2-20 Years) data.   Wt Readings from Last 3 Encounters:  07/16/22 128 lb 9.6 oz (58.3 kg) (55 %, Z= 0.13)*  06/06/22 128 lb 9.6 oz (58.3 kg) (56 %, Z= 0.15)*  05/16/22 127 lb 12.8 oz (58 kg) (55 %, Z= 0.12)*   * Growth percentiles are based on CDC (Girls, 2-20 Years) data.   HC Readings from Last 3 Encounters:  No data found for [facility]   Body surface area is 1.6 meters squared.  26 %ile (Z= -0.65) based on CDC (Girls, 2-20 Years)  Stature-for-age data based on Stature recorded on 07/16/2022. 55 %ile (Z= 0.13) based on CDC (Girls, 2-20 Years) weight-for-age data using vitals from 07/16/2022. No head circumference on file for this encounter.   PHYSICAL EXAM:   Constitutional: The patient appears healthy and well nourished. The patient's height and weight are normal for age.   Weight is stable since last visit. Marland Kitchen  Head: The head is normocephalic. Face: The face appears normal. There are no obvious dysmorphic features. Eyes: The eyes appear to be normally formed and spaced. Gaze is conjugate. There is no obvious arcus or proptosis. Moisture appears normal. Ears: The ears are normally placed and appear externally  normal. Mouth: The oropharynx and tongue appear normal. Dentition appears to be normal for age. Oral moisture is normal. Neck: The neck appears to be visibly normal.  Lungs: no increased work of breathing Heart: regular pulses and peripheral perfusion Abdomen: The abdomen appears to be large in size for the patient's age. There is no obvious hepatomegaly, splenomegaly, or other mass effect.  Arms: Muscle size and bulk are normal for age. Scarring on left arm.  Hands: There is no obvious tremor. Phalangeal and metacarpophalangeal joints are normal. Palmar muscles are normal for age. Palmar skin is normal. Palmar moisture is also normal. Legs: Muscles appear normal for age. No edema is present. Feet: Feet are normally formed. Dorsalis pedal pulses are normal. Neurologic: Strength is normal for age in both the upper and lower extremities. Muscle tone is normal. Sensation to touch is normal in both the legs and feet.   Skin: well healed surgical scar anterior neck. Multiple tattoos.   LAB DATA:    Office Visit on 06/06/2022  Component Date Value Ref Range Status   Preg Test, Ur 06/06/2022 Negative  Negative Final   C. trachomatis RNA, TMA 06/06/2022 NOT DETECTED  NOT DETECTED Final   N. gonorrhoeae RNA, TMA  06/06/2022 NOT DETECTED  NOT DETECTED Final   Comment: The analytical performance characteristics of this assay, when used to test SurePath(TM) specimens have been determined by [employer]. The modifications have not been cleared or approved by the FDA. This assay has been validated pursuant to the CLIA regulations and is used for clinical purposes. . For additional information, please refer to https://education.questdiagnostics.com/faq/FAQ154 (This link is being provided for information/ educational purposes only.) .     Lab Results  Component Value Date   TSH 11.18 (H) 05/16/2022   TSH 9.38 (H) 04/15/2022   TSH 11.09 (H) 03/19/2021   TSH 5.45 (H) 09/14/2020   TSH 0.53 06/12/2020   TSH 0.24 (L) 03/09/2020   Lab Results  Component Value Date   FREET4 1.3 05/16/2022   FREET4 1.0 04/15/2022   FREET4 1.2 03/19/2021   FREET4 1.1 09/14/2020   FREET4 1.6 (H) 06/12/2020   FREET4 1.5 (H) 03/09/2020    Pending    Assessment and Plan:   ASSESSMENT: Valerie [name] is a 21 y.o. AA female with history of difficult to manage Grave's Disease/hyperthyroidism treated with thyroidectomy at age 48 for definitive therapy.   Post Surgical Hypothyroid - clinically euthyroid - 88 mcg of Synthroid daily - She reports good adherence since last visit  Neutropenia/Anemia - Referral placed after last visit to hematology - Valerie [name] did not answer her phone and referral was closed - Will repeat labs today and consider a new referral to the [facility]  Significant weight loss - Weight has stabilized since last visit   PLAN:    1. Diagnostic:  Lab Orders         CBC with Differential/Platelet         TSH         T4, free         Fe+TIBC+Fer      2. Therapeutic: Continue Synthroid 88 mcg daily  3. Patient education: Discussions as above.  4. Follow-up: Return in about 2 months (around 09/15/2022).    Dessa Phi, MD  Level of Service: >30 minutes spent today reviewing the  medical chart, counseling the patient/family, and documenting today's encounter.

## 2022-07-17 LAB — CBC WITH DIFFERENTIAL/PLATELET
Hemoglobin: 8.3 g/dL — ABNORMAL LOW (ref 11.5–15.3)
Lymphs Abs: 1382 cells/uL (ref 1200–5200)
MCV: 73.4 fL — ABNORMAL LOW (ref 78.0–98.0)
Neutrophils Relative %: 52.7 %
RBC: 3.91 10*6/uL (ref 3.80–5.10)
RDW: 17.4 % — ABNORMAL HIGH (ref 11.0–15.0)

## 2022-07-17 LAB — T4, FREE: Free T4: 0.7 ng/dL — ABNORMAL LOW (ref 0.8–1.4)

## 2022-07-17 LAB — IRON,TIBC AND FERRITIN PANEL
%SAT: 3 % (calc) — ABNORMAL LOW (ref 15–45)
Ferritin: 2 ng/mL — ABNORMAL LOW (ref 6–67)
Iron: 14 ug/dL — ABNORMAL LOW (ref 27–164)
TIBC: 464 mcg/dL (calc) — ABNORMAL HIGH (ref 271–448)

## 2022-07-17 LAB — TSH: TSH: 71.66 mIU/L — ABNORMAL HIGH

## 2022-09-16 ENCOUNTER — Encounter (INDEPENDENT_AMBULATORY_CARE_PROVIDER_SITE_OTHER): Payer: Self-pay | Admitting: Pediatric Endocrinology

## 2022-09-16 ENCOUNTER — Ambulatory Visit (INDEPENDENT_AMBULATORY_CARE_PROVIDER_SITE_OTHER): Payer: Medicaid Other | Admitting: Pediatric Endocrinology

## 2022-09-16 DIAGNOSIS — E063 Autoimmune thyroiditis: Secondary | ICD-10-CM

## 2022-09-16 MED ORDER — LEVOTHYROXINE SODIUM 100 MCG PO TABS: 100.0000 ug | ORAL_TABLET | Freq: Every day | ORAL | 1 refills | Status: DC

## 2022-09-16 NOTE — Patient Instructions (Signed)
Increase Synthroid to 100 mcg daily.   Labs for next visit

## 2022-09-16 NOTE — Progress Notes (Signed)
Subjective:  Patient Name: Valerie [name] Date of Birth: May 31, 2001  MRN: [id number]  Valerie [name]  presents to the office today for follow-up evaluation and management  of her hypothyroidism following thyroidectomy in January 2013  HISTORY OF PRESENT ILLNESS:   Valerie [name] is a 21 y.o. AA female .  [name] was accompanied by her girlfriend, [name]   1. Valerie [name] has been followed in this clinic since 03/16/07.  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 At times she was taking up to four 5 mg tablets of methimazole, twice daily. At other times we had to stop the methimazole completely.  Her TSI values ranged from 200-564% of baseline, with normal being less than 120-140. In the spring and summer of 2012 she became hypothyroid for several months and we had to treat her with Synthroid. Her family chose to seek definitive therapy. She had surgery on January 17th 2013 for removal of her thyroid gland at [facility]. Post operatively she did not have any problems with calcium metabolism, no changes in her voice, or trouble swallowing. She did become rapidly and profoundly hypothyroid. She was placed on escalating doses of synthroid trying to get her thyroid hormone levels in range.    2. The patient's last Pediatric endocrine visit was on 07/16/22. In the interim, she has been generally healthy.     She has continued to do well with taking her medication.   She has continued on 88 mcg of levothyroxine daily.   She is (sadly) still working at [employer]. She is also working at  [employer] is short - currently with significant hair extensions- no issues She is always tired No issues with constipation or diarrhea She is still frequently cold  LMP 9/12. No vomiting with her menses in September or August  3. Pertinent Review of Systems:   Constitutional: The patient feels "tired". The patient seems healthy and active.  Eyes: Vision seems to be good. There are no  recognized eye problems. Wears glasses - left them at home. She doesn't think that she needs them.  Neck: There are no recognized problems of the anterior neck.  Heart: There are no recognized heart problems. The ability to play and do other physical activities seems normal.  Lungs: no asthma or wheezing.  Gastrointestinal: Bowel movents seem normal. There are no recognized GI problems. Legs: Muscle mass and strength seem normal. The child can play and perform other physical activities without obvious discomfort. No edema is noted.  Feet: There are no obvious foot problems. No edema is noted. Neurologic: There are no recognized problems with muscle movement and strength, sensation, or coordination. GYN: Menarche 12/15 (age 21). 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 TAKE 1 TABLET(88 MCG) BY MOUTH DAILY, Disp: 90 tablet, Rfl: 1   norethindrone (AYGESTIN) 5 MG tablet, Take 1 tablet (5 mg total) by mouth daily. (Patient not taking: Reported on 07/16/2022), Disp: 90 tablet, Rfl: 0  Allergies as of 09/16/2022 - Review Complete 09/16/2022  Allergen Reaction Noted   Penicillins Swelling 10/29/2011     reports that she has never smoked. She  has never used smokeless tobacco. She reports current drug use. Drug: Marijuana. She reports that she does not drink alcohol. Pediatric History  Patient Parents   Sapien,Tiffany (Mother)   Other Topics Concern   Not on file  Social History Narrative      Lives with parents, 2 brothers and 1 sister. Older brother not living at home.    Graduated 2022- 4   Enjoys writing and music.               [employer]. Lives with mom and siblings.  Working at [employer] and [employer]: No primary care provider on file.  ROS: There are no other significant problems involving Valerie [name]'s other body systems.   Objective:  Vital Signs:    BP 124/82 (BP Location: Right Arm, Patient Position: Sitting, Cuff Size: Large)   Pulse 64   Wt 124 lb 12.8 oz (56.6 kg)   LMP 08/27/2022 (Approximate)   BMI 22.39 kg/m   Blood pressure %iles are not available for patients who are 18 years or older.   Ht Readings from Last 3 Encounters:  07/16/22 5' 2.6" (1.59 m) (26 %, Z= -0.65)*  06/06/22 5' 2.21" (1.58 m) (21 %, Z= -0.80)*  05/16/22 5' 2.95" (1.599 m) (30 %, Z= -0.51)*   * Growth percentiles are based on CDC (Girls, 2-20 Years) data.   Wt Readings from Last 3 Encounters:  09/16/22 124 lb 12.8 oz (56.6 kg) (47 %, Z= -0.07)*  07/16/22 128 lb 9.6 oz (58.3 kg) (55 %, Z= 0.13)*  06/06/22 128 lb 9.6 oz (58.3 kg) (56 %, Z= 0.15)*   * Growth percentiles are based on CDC (Girls, 2-20 Years) data.   HC Readings from Last 3 Encounters:  No data found for [facility]   Body surface area is 1.58 meters squared.  No height on file for this encounter. 47 %ile (Z= -0.07) based on CDC (Girls, 2-20 Years) weight-for-age data using vitals from 09/16/2022. No head circumference on file for this encounter.   PHYSICAL EXAM:    Constitutional: The patient appears healthy and well nourished. The patient's height and weight are normal for age.   Weight is  decreased 4 pounds since last visit .  Head: The head is normocephalic. Face: The face appears normal. There are no obvious dysmorphic features. Eyes: The eyes appear to be normally formed and spaced. Gaze is conjugate. There is no obvious arcus or proptosis. Moisture appears normal. Ears: The ears are normally placed and appear externally normal. Mouth: The oropharynx and tongue appear normal. Dentition appears to be normal for age. Oral moisture is normal. Neck: The neck appears to be visibly normal.  Lungs: no increased work of breathing Heart: regular pulses and peripheral perfusion Abdomen: The abdomen appears to be large in size for the patient's age. There is no obvious hepatomegaly, splenomegaly, or other mass effect.  Arms: Muscle size and bulk are normal for age. Scarring on left arm.  Hands:  There is no obvious tremor. Phalangeal and metacarpophalangeal joints are normal. Palmar muscles are normal for age. Palmar skin is normal. Palmar moisture is also normal. Legs: Muscles appear normal for age. No edema is present. Feet: Feet are normally formed. Dorsalis pedal pulses are normal. Neurologic: Strength is normal for age in both the upper and lower extremities. Muscle tone is normal. Sensation to touch is normal in both the legs and feet.   Skin: well healed surgical scar anterior neck. Multiple tattoos.   LAB DATA:    Office Visit on 07/16/2022  Component Date Value Ref Range Status   WBC 07/16/2022 3.6 (L)  4.5 - 13.0 Thousand/uL Final   RBC 07/16/2022 3.91  3.80 - 5.10 Million/uL Final   Hemoglobin 07/16/2022 8.3 (L)  11.5 - 15.3 g/dL Final   HCT 18/40/3754 28.7 (L)  34.0 - 46.0 % Final   MCV 07/16/2022 73.4 (L)  78.0 - 98.0 fL Final   MCH 07/16/2022 21.2 (L)  25.0 - 35.0 pg Final   MCHC 07/16/2022 28.9 (L)  31.0 - 36.0 g/dL Final   RDW 36/05/7702 17.4 (H)  11.0 - 15.0 % Final   Platelets 07/16/2022 317  140 - 400 Thousand/uL Final   MPV 07/16/2022 9.7  7.5 - 12.5 fL Final    Neutro Abs 07/16/2022 1,897  1,800 - 8,000 cells/uL Final   Lymphs Abs 07/16/2022 1,382  1,200 - 5,200 cells/uL Final   Absolute Monocytes 07/16/2022 310  200 - 900 cells/uL Final   Eosinophils Absolute 07/16/2022 0 (L)  15 - 500 cells/uL Final   Basophils Absolute 07/16/2022 11  0 - 200 cells/uL Final   Neutrophils Relative % 07/16/2022 52.7  % Final   Total Lymphocyte 07/16/2022 38.4  % Final   Monocytes Relative 07/16/2022 8.6  % Final   Eosinophils Relative 07/16/2022 0.0  % Final   Basophils Relative 07/16/2022 0.3  % Final   TSH 07/16/2022 71.66 (H)  mIU/L Final   Comment:            Reference Range .            1-19 Years 0.50-4.30 .                Pregnancy Ranges            First trimester   0.26-2.66            Second trimester  0.55-2.73            Third trimester   0.43-2.91    Free T4 07/16/2022 0.7 (L)  0.8 - 1.4 ng/dL Final   Iron 40/35/2481 14 (L)  27 - 164 mcg/dL Final   TIBC 85/90/9311 464 (H)  271 - 448 mcg/dL (calc) Final   %SAT 21/62/4469 3 (L)  15 - 45 % (calc) Final   Ferritin 07/16/2022 2 (L)  6 - 67 ng/mL Final    Lab Results  Component Value Date   TSH 71.66 (H) 07/16/2022   TSH 11.18 (H) 05/16/2022   TSH 9.38 (H) 04/15/2022   TSH 11.09 (H) 03/19/2021   TSH 5.45 (H) 09/14/2020   TSH 0.53 06/12/2020   Lab Results  Component Value Date   FREET4 0.7 (L) 07/16/2022   FREET4 1.3 05/16/2022   FREET4 1.0 04/15/2022   FREET4 1.2 03/19/2021   FREET4 1.1 09/14/2020   FREET4 1.6 (H) 06/12/2020    Pending    Assessment and Plan:   ASSESSMENT: Anetha is a 21 y.o. AA  female with history of difficult to manage Grave's Disease/hyperthyroidism treated with thyroidectomy at age 65 for definitive therapy.   Post Surgical Hypothyroid - clinically euthyroid - 88 mcg of Synthroid daily -> increase to 100 mcg daily - She reports good adherence since last visit   Significant weight loss - Weight has continued to fluctuate   PLAN:    1. Diagnostic:   Lab Orders  No laboratory test(s) ordered today     2. Therapeutic: Continue Synthroid 88 mcg daily  3. Patient education: Discussions as above.  4. Follow-up: Return in about 2 months (around 11/16/2022).    Dessa Phi, MD  Level of Service: >30 minutes spent today reviewing the medical chart, counseling the patient/family, and documenting today's encounter.

## 2022-11-18 ENCOUNTER — Encounter (INDEPENDENT_AMBULATORY_CARE_PROVIDER_SITE_OTHER): Payer: Self-pay | Admitting: Pediatric Endocrinology

## 2022-11-18 ENCOUNTER — Ambulatory Visit (INDEPENDENT_AMBULATORY_CARE_PROVIDER_SITE_OTHER): Payer: Medicaid Other | Admitting: Pediatric Endocrinology

## 2022-11-18 DIAGNOSIS — E063 Autoimmune thyroiditis: Secondary | ICD-10-CM

## 2022-11-18 DIAGNOSIS — E890 Postprocedural hypothyroidism: Secondary | ICD-10-CM | POA: Diagnosis not present

## 2022-11-18 MED ORDER — LEVOTHYROXINE SODIUM 100 MCG PO TABS: 100.0000 ug | ORAL_TABLET | Freq: Every day | ORAL | 1 refills | Status: DC

## 2022-11-18 NOTE — Progress Notes (Signed)
Subjective:  Patient Name: Valerie [name] Date of Birth: Aug 23, 2001  MRN: [id number]  Valerie [name]  presents to the office today for follow-up evaluation and management  of her hypothyroidism following thyroidectomy in January 2013  HISTORY OF PRESENT ILLNESS:   Valerie [name] is a 21 y.o. AA female .  Valerie [name] was accompanied by her girlfriend, Valerie [name]   1. Valerie [name] has been followed in this clinic since 03/16/07.  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 At times she was taking up to four 5 mg tablets of methimazole, twice daily. At other times we had to stop the methimazole completely.  Her TSI values ranged from 200-564% of baseline, with normal being less than 120-140. In the spring and summer of 2012 she became hypothyroid for several months and we had to treat her with Synthroid. Her family chose to seek definitive therapy. She had surgery on January 17th 2013 for removal of her thyroid gland at [facility]. Post operatively she did not have any problems with calcium metabolism, no changes in her voice, or trouble swallowing. She did become rapidly and profoundly hypothyroid. She was placed on escalating doses of synthroid trying to get her thyroid hormone levels in range.    2. The patient's last Pediatric endocrine visit was on 09/16/22. In the interim, she has been generally healthy.     She has been doing "okay" with taking her thyroid medication. She is taking it about 2-3 days a week.   At her last visit we increased her dose to 100 mcg of Levothyroxine. However, she is not being consistent with taking it. She says that she is trying to remember to take it when she gets home from work but she doesn't always remember. Valerie [name] says that she is "hard headed".   She is (sadly) still working at [employer]. She is trying to get a holiday job at [employer] by her house for more hours.   She is still interested in phlebotomy   Hair is short - currently with significant  hair extensions- no issues She is always tired No issues with constipation or diarrhea She is still frequently cold  LMP 11/8. No longer having vomiting with her periods.   3. Pertinent Review of Systems:   Constitutional: The patient feels "ok just tired". The patient seems healthy and active.  Eyes: Vision seems to be good. There are no recognized eye problems. Wears glasses - left them at home. She doesn't think that she needs them.  Neck: There are no recognized problems of the anterior neck.  Heart: There are no recognized heart problems. The ability to play and do other physical activities seems normal.  Lungs: no asthma or wheezing.  Gastrointestinal: Bowel movents seem normal. There are no recognized GI problems. Legs: Muscle mass and strength seem normal. The child can play and perform other physical activities without obvious discomfort. No edema is noted.  Feet: There are no obvious foot problems. No edema is noted. Neurologic: There are no recognized problems with muscle movement and strength, sensation, or coordination. GYN: Menarche 12/15 (age 6). 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 (Patient not taking: Reported on 07/16/2022), Disp: 90 tablet, Rfl: 0  Allergies as of 11/18/2022 - Review Complete 11/18/2022  Allergen Reaction Noted   Penicillins Swelling 10/29/2011     reports that she has never smoked. She has never used smokeless tobacco. She reports current drug use. Drug: Marijuana. She reports that she does not drink alcohol. Pediatric History  Patient Parents   Valerie [name],Valerie [name] (Mother)   Other Topics Concern   Not on file  Social History Narrative      Lives with parents, 2 brothers and 1 sister. Older brother not living at home.    Graduated 2022- 32   Enjoys writing and music.               [employer]. Lives with mom and siblings.  Working at [employer] [place]  Primary Care Provider: No primary care provider on file.  ROS: There are no other significant problems involving Valerie [name]'s other body systems.   Objective:  Vital Signs:    BP 108/68 (BP Location: Right Arm, Patient Position: Sitting, Cuff Size: Small)   Pulse 72   Wt 124 lb 9.6 oz (56.5 kg)   LMP 10/23/2022 (Exact Date)   BMI 22.36 kg/m   Blood pressure %iles are not available for patients who are 18 years or older.   Ht Readings from Last 3 Encounters:  07/16/22 5' 2.6" (1.59 m) (26 %, Z= -0.65)*  06/06/22 5' 2.21" (1.58 m) (21 %, Z= -0.80)*  05/16/22 5' 2.95" (1.599 m) (30 %, Z= -0.51)*   * Growth percentiles are based on CDC (Girls, 2-20 Years) data.   Wt Readings from Last 3 Encounters:  11/18/22 124 lb 9.6 oz (56.5 kg) (46 %, Z= -0.10)*  09/16/22 124 lb 12.8 oz (56.6 kg) (47 %, Z= -0.07)*  07/16/22 128 lb 9.6 oz (58.3 kg) (55 %, Z= 0.13)*   * Growth percentiles are based on CDC (Girls, 2-20 Years) data.   HC Readings from Last 3 Encounters:  No data found for [facility]   Body surface area is 1.58 meters squared.  No height on file for this  encounter. 46 %ile (Z= -0.10) based on CDC (Girls, 2-20 Years) weight-for-age data using vitals from 11/18/2022. No head circumference on file for this encounter.   PHYSICAL EXAM:    Constitutional: The patient appears healthy and well nourished. The patient's height and weight are normal for age.   Weight is decreased 4 pounds since last visit .  Head: The head is normocephalic. Face: The face appears normal. There are no obvious dysmorphic features. Eyes: The eyes appear to be normally formed and spaced. Gaze is conjugate. There is no obvious arcus or proptosis. Moisture appears normal. Ears: The ears are normally placed and appear externally normal. Mouth: The oropharynx and tongue appear normal. Dentition appears to be normal  for age. Oral moisture is normal. Neck: The neck appears to be visibly normal.  Lungs: no increased work of breathing Heart: regular pulses and peripheral perfusion Abdomen: The abdomen appears to be large in size for the patient's age. There is no obvious hepatomegaly, splenomegaly, or other mass effect.  Arms: Muscle size and bulk are normal for age. Scarring on left arm.  Hands: There is no obvious tremor. Phalangeal and metacarpophalangeal joints are normal. Palmar muscles are normal for age. Palmar skin is normal. Palmar moisture is also normal. Legs: Muscles appear normal for age. No edema is present. Feet: Feet are normally formed. Dorsalis pedal pulses are normal. Neurologic: Strength is normal for age in both the upper and lower extremities. Muscle tone is normal. Sensation to touch is normal in both the legs and feet.   Skin: well healed surgical scar anterior neck. Multiple tattoos.   LAB DATA:    Office Visit on 07/16/2022  Component Date Value Ref Range Status   WBC 07/16/2022 3.6 (L)  4.5 - 13.0 Thousand/uL Final   RBC 07/16/2022 3.91  3.80 - 5.10 Million/uL Final   Hemoglobin 07/16/2022 8.3 (L)  11.5 - 15.3 g/dL Final   HCT 07/16/2022 28.7 (L)   34.0 - 46.0 % Final   MCV 07/16/2022 73.4 (L)  78.0 - 98.0 fL Final   MCH 07/16/2022 21.2 (L)  25.0 - 35.0 pg Final   MCHC 07/16/2022 28.9 (L)  31.0 - 36.0 g/dL Final   RDW 07/16/2022 17.4 (H)  11.0 - 15.0 % Final   Platelets 07/16/2022 317  140 - 400 Thousand/uL Final   MPV 07/16/2022 9.7  7.5 - 12.5 fL Final   Neutro Abs 07/16/2022 1,897  1,800 - 8,000 cells/uL Final   Lymphs Abs 07/16/2022 1,382  1,200 - 5,200 cells/uL Final   Absolute Monocytes 07/16/2022 310  200 - 900 cells/uL Final   Eosinophils Absolute 07/16/2022 0 (L)  15 - 500 cells/uL Final   Basophils Absolute 07/16/2022 11  0 - 200 cells/uL Final   Neutrophils Relative % 07/16/2022 52.7  % Final   Total Lymphocyte 07/16/2022 38.4  % Final   Monocytes Relative 07/16/2022 8.6  % Final   Eosinophils Relative 07/16/2022 0.0  % Final   Basophils Relative 07/16/2022 0.3  % Final   TSH 07/16/2022 71.66 (H)  mIU/L Final   Comment:            Reference Range .            1-19 Years 0.50-4.30 .                Pregnancy Ranges            First trimester   0.26-2.66            Second trimester  0.55-2.73            Third trimester   0.43-2.91    Free T4 07/16/2022 0.7 (L)  0.8 - 1.4 ng/dL Final   Iron 07/16/2022 14 (L)  27 - 164 mcg/dL Final   TIBC 07/16/2022 464 (H)  271 - 448 mcg/dL (calc) Final   %SAT 07/16/2022 3 (L)  15 - 45 % (calc) Final   Ferritin 07/16/2022 2 (L)  6 - 67 ng/mL Final    Lab Results  Component Value Date   TSH 71.66 (H) 07/16/2022   TSH 11.18 (H) 05/16/2022   TSH 9.38 (H) 04/15/2022   TSH 11.09 (H) 03/19/2021   TSH 5.45 (  H) 09/14/2020   TSH 0.53 06/12/2020   Lab Results  Component Value Date   FREET4 0.7 (L) 07/16/2022   FREET4 1.3 05/16/2022   FREET4 1.0 04/15/2022   FREET4 1.2 03/19/2021   FREET4 1.1 09/14/2020   FREET4 1.6 (H) 06/12/2020    Pending    Assessment and Plan:   ASSESSMENT: Chrisa is a 21 y.o. AA female with history of difficult to manage Grave's Disease/hyperthyroidism  treated with thyroidectomy at age 27 for definitive therapy.   Post Surgical Hypothyroid - clinically euthyroid - Synthroid 100 mcg daily - NEED TO WORK ON TAKING THIS MEDICATION DAILY  Weight stable.    PLAN:    1. Diagnostic:  Lab Orders  No laboratory test(s) ordered today   2. Therapeutic: Continue Synthroid 88 mcg daily - work on taking it daily for 6 weeks- then we will get labs in January.  3. Patient education: Discussions as above.  4. Follow-up: Return in about 6 weeks (around 12/30/2022).    Dessa Phi, MD  Level of Service: >30 minutes spent today reviewing the medical chart, counseling the patient/family, and documenting today's encounter.

## 2022-11-18 NOTE — Patient Instructions (Signed)
Take your medicine when you brush your teeth!

## 2023-01-21 ENCOUNTER — Ambulatory Visit (INDEPENDENT_AMBULATORY_CARE_PROVIDER_SITE_OTHER): Payer: Medicaid Other | Admitting: Pediatric Endocrinology

## 2023-01-21 ENCOUNTER — Encounter (INDEPENDENT_AMBULATORY_CARE_PROVIDER_SITE_OTHER): Payer: Self-pay | Admitting: Pediatric Endocrinology

## 2023-01-21 VITALS — BP 120/70 | HR 72 | Wt 132.0 lb

## 2023-01-21 DIAGNOSIS — E890 Postprocedural hypothyroidism: Secondary | ICD-10-CM

## 2023-01-21 NOTE — Progress Notes (Signed)
Subjective:  Patient Name: Valerie [name] Date of Birth: 17-Apr-2001  MRN: [id number]  Valerie [name]  presents to the office today for follow-up evaluation and management  of her hypothyroidism following thyroidectomy in January 2013  HISTORY OF PRESENT ILLNESS:   Valerie [name] is a 22 y.o. AA female .  Eilish was accompanied by her girlfriend, Valerie [name]   1. Azarah has been followed in this clinic since 03/16/07.  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 At times she was taking up to four 5 mg tablets of methimazole, twice daily. At other times we had to stop the methimazole completely.  Her TSI values ranged from 200-564% of baseline, with normal being less than 120-140. In the spring and summer of 2012 she became hypothyroid for several months and we had to treat her with Synthroid. Her family chose to seek definitive therapy. She had surgery on January 17th 2013 for removal of her thyroid gland at [facility]. Post operatively she did not have any problems with calcium metabolism, no changes in her voice, or trouble swallowing. She did become rapidly and profoundly hypothyroid. She was placed on escalating doses of synthroid trying to get her thyroid hormone levels in range.    2. The patient's last Pediatric endocrine visit was on 11/18/22. In the interim, she has been generally healthy.     Charly reports that she has been doing a "much better" job with remembering to take her Synthroid medication. She says that she has increased from 2-3 tabs a week to 5-7 tabs per week. Her GF [employer].   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   She is still interested in phlebotomy   Hair is short - currently with significant hair extensions- no issues She is always tired No issues with constipation or diarrhea She is still frequently cold  LMP 1/28. No longer having vomiting with her periods. Bad Cramps  3. Pertinent Review of  Systems:   Constitutional: The patient feels "tired". The patient seems healthy and active.  Eyes: Vision seems to be good. There are no recognized eye problems. Wears glasses - left them at home. She doesn't think that she needs them.  Neck: There are no recognized problems of the anterior neck.  Heart: There are no recognized heart problems. The ability to play and do other physical activities seems normal.  Lungs: no asthma or wheezing.  Gastrointestinal: Bowel movents seem normal. There are no recognized GI problems. Legs: Muscle mass and strength seem normal. The child can play and perform other physical activities without obvious discomfort. No edema is noted.  Feet: There are no obvious foot problems. No edema is noted. Neurologic: There are no recognized problems with muscle movement and strength, sensation, or coordination. GYN: Menarche 12/15 (age 42). 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 (Patient not taking: Reported on 07/16/2022), Disp: 90 tablet, Rfl: 0  Allergies as of 01/21/2023 - Review Complete 01/21/2023  Allergen Reaction Noted   Penicillins Swelling  10/29/2011     reports that she has never smoked. She has never used smokeless tobacco. She reports current drug use. Drug: Marijuana. She reports that she does not drink alcohol. Pediatric History  Patient Parents   Lavey,Tiffany (Mother)   Other Topics Concern   Not on file  Social History Narrative      Lives with parents, 2 brothers and 1 sister. Older brother not living at home.    Graduated 2022- 23   Enjoys writing and music.               Graduated [employer]. .  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   ROS: There are no other significant problems involving Amana's other body systems.   Objective:  Vital Signs:    BP 120/70 (BP Location: Right Arm, Patient Position: Sitting, Cuff Size: Large)   Pulse 72   Wt 132 lb (59.9 kg)   LMP 01/12/2023 (Exact Date)   BMI 23.68 kg/m   Blood pressure %iles are not available for patients who are 18 years or older.   Ht Readings from Last 3 Encounters:  07/16/22 5' 2.6" (1.59 m) (26 %, Z= -0.65)*  06/06/22 5' 2.21" (1.58 m) (21 %, Z= -0.80)*  05/16/22 5' 2.95" (1.599 m) (30 %, Z= -0.51)*   * Growth percentiles are based on CDC (Girls, 2-20 Years) data.   Wt Readings from Last 3 Encounters:  01/21/23 132 lb (59.9 kg) (59 %, Z= 0.23)*  11/18/22 124 lb 9.6 oz (56.5 kg) (46 %, Z= -0.10)*  09/16/22 124 lb 12.8 oz (56.6 kg) (47 %, Z= -0.07)*   * Growth percentiles are based on CDC (Girls, 2-20 Years) data.   HC Readings from Last 3 Encounters:  No data found for [facility]   Body surface area is 1.63 meters squared.  No height on file for this encounter. 59 %ile (Z= 0.23) based on CDC (Girls, 2-20 Years) weight-for-age data using vitals from 01/21/2023. No head circumference on file for this encounter.   PHYSICAL EXAM:    Constitutional: The patient appears healthy and well nourished. The patient's height and weight are normal for age.   Weight is increased 8  pounds since last visit .  Head: The head is normocephalic. Face: The face appears normal. There are no obvious dysmorphic features. Eyes: The eyes appear to be normally formed and spaced. Gaze is conjugate. There is no obvious arcus or proptosis. Moisture appears normal. Ears: The ears are normally placed and appear externally normal. Mouth: The oropharynx and tongue appear normal. Dentition appears to be normal for age. Oral moisture is normal. Neck: The neck appears to be visibly normal.  Lungs: no increased work of breathing Heart: regular pulses and peripheral perfusion Abdomen: The abdomen appears to be large in size for the patient's age. There is no obvious hepatomegaly, splenomegaly, or other mass effect.  Arms: Muscle size and bulk are normal for age.  Scarring on left arm.  Hands: There is no obvious tremor. Phalangeal and metacarpophalangeal joints are normal. Palmar muscles are normal for age. Palmar skin is normal. Palmar moisture is also normal. Legs: Muscles appear normal for age. No edema is present. Feet: Feet are normally formed. Dorsalis pedal pulses are normal. Neurologic: Strength is normal for age in both the upper and lower extremities. Muscle tone is normal. Sensation to touch is normal in both the legs and feet.   Skin: well healed surgical scar anterior neck. Multiple tattoos.   LAB DATA:    Office Visit on 07/16/2022  Component Date Value Ref Range Status   WBC 07/16/2022 3.6 (L)  4.5 - 13.0 Thousand/uL Final   RBC 07/16/2022 3.91  3.80 - 5.10 Million/uL Final   Hemoglobin 07/16/2022 8.3 (L)  11.5 - 15.3 g/dL Final   HCT 07/16/2022 28.7 (L)  34.0 - 46.0 % Final   MCV 07/16/2022 73.4 (L)  78.0 - 98.0 fL Final   MCH 07/16/2022 21.2 (L)  25.0 - 35.0 pg Final   MCHC 07/16/2022 28.9 (L)  31.0 - 36.0 g/dL Final   RDW 07/16/2022 17.4 (H)  11.0 - 15.0 % Final   Platelets 07/16/2022 317  140 - 400 Thousand/uL Final   MPV 07/16/2022 9.7  7.5 - 12.5 fL Final   Neutro  Abs 07/16/2022 1,897  1,800 - 8,000 cells/uL Final   Lymphs Abs 07/16/2022 1,382  1,200 - 5,200 cells/uL Final   Absolute Monocytes 07/16/2022 310  200 - 900 cells/uL Final   Eosinophils Absolute 07/16/2022 0 (L)  15 - 500 cells/uL Final   Basophils Absolute 07/16/2022 11  0 - 200 cells/uL Final   Neutrophils Relative % 07/16/2022 52.7  % Final   Total Lymphocyte 07/16/2022 38.4  % Final   Monocytes Relative 07/16/2022 8.6  % Final   Eosinophils Relative 07/16/2022 0.0  % Final   Basophils Relative 07/16/2022 0.3  % Final   TSH 07/16/2022 71.66 (H)  mIU/L Final   Comment:            Reference Range .            1-19 Years 0.50-4.30 .                Pregnancy Ranges            First trimester   0.26-2.66            Second trimester  0.55-2.73            Third trimester   0.43-2.91    Free T4 07/16/2022 0.7 (L)  0.8 - 1.4 ng/dL Final   Iron 07/16/2022 14 (L)  27 - 164 mcg/dL Final   TIBC 07/16/2022 464 (H)  271 - 448 mcg/dL (calc) Final   %SAT 07/16/2022 3 (L)  15 - 45 % (calc) Final   Ferritin 07/16/2022 2 (L)  6 - 67 ng/mL Final    Lab Results  Component Value Date   TSH 71.66 (H) 07/16/2022   TSH 11.18 (H) 05/16/2022   TSH 9.38 (H) 04/15/2022   TSH 11.09 (H) 03/19/2021   TSH 5.45 (H) 09/14/2020   TSH 0.53 06/12/2020   Lab Results  Component Value Date   FREET4 0.7 (L) 07/16/2022   FREET4 1.3 05/16/2022   FREET4 1.0 04/15/2022   FREET4 1.2 03/19/2021   FREET4 1.1 09/14/2020   FREET4 1.6 (H) 06/12/2020    Pending    Assessment and Plan:   ASSESSMENT:  Samreen is a 22 y.o. AA female with history of difficult to manage Grave's Disease/hyperthyroidism treated with thyroidectomy at age 22 for definitive therapy.   Post Surgical Hypothyroid - clinically euthyroid - Synthroid 100 mcg daily - Has been working on taking more consistently.  - Took Synthroid about 1 hour prior to labs  Weight improved  PLAN:    1. Diagnostic:  Lab Orders         T4, free         TSH      2. Therapeutic: Continue Synthroid 100 mcg daily -  3. Patient education: Discussions as above.  4. Follow-up: Return in about 2 months (around 03/22/2023).    Lelon Huh, MD  Level of Service: >30 minutes spent today reviewing the medical chart, counseling the patient/family, and documenting today's encounter.

## 2023-01-22 LAB — TSH: TSH: 18.18 mIU/L — ABNORMAL HIGH

## 2023-01-22 LAB — T4, FREE: Free T4: 1 ng/dL (ref 0.8–1.4)

## 2023-04-14 ENCOUNTER — Encounter (INDEPENDENT_AMBULATORY_CARE_PROVIDER_SITE_OTHER): Payer: Self-pay | Admitting: Pediatric Endocrinology

## 2023-04-14 ENCOUNTER — Ambulatory Visit (INDEPENDENT_AMBULATORY_CARE_PROVIDER_SITE_OTHER): Payer: Medicaid Other | Admitting: Pediatric Endocrinology

## 2023-04-14 VITALS — BP 120/70 | HR 72 | Wt 126.8 lb

## 2023-04-14 DIAGNOSIS — E890 Postprocedural hypothyroidism: Secondary | ICD-10-CM

## 2023-04-14 NOTE — Progress Notes (Signed)
Subjective:  Patient Name: Valerie [name] Date of Birth: 04-Nov-2001  MRN: [id number]  Valerie [name]  presents to the office today for follow-up evaluation and management  of her hypothyroidism following thyroidectomy in January 2013  HISTORY OF PRESENT ILLNESS:   Valerie [name] is a 22 y.o. AA female .  Valerie [name] was accompanied by her girlfriend, [name]   1. Valerie [name] has been followed in this clinic since 03/16/07.  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 At times she was taking up to four 5 mg tablets of methimazole, twice daily. At other times we had to stop the methimazole completely.  Her TSI values ranged from 200-564% of baseline, with normal being less than 120-140. In the spring and summer of 2012 she became hypothyroid for several months and we had to treat her with Synthroid. Her family chose to seek definitive therapy. She had surgery on January 17th 2013 for removal of her thyroid gland at [facility]. Post operatively she did not have any problems with calcium metabolism, no changes in her voice, or trouble swallowing. She did become rapidly and profoundly hypothyroid. She was placed on escalating doses of synthroid trying to get her thyroid hormone levels in range.    2. The patient's last Pediatric endocrine visit was on 01/21/23. In the interim, she has been generally healthy.     She is currently doing well with taking her medication. She states that she takes it about 6 mornings a week. At least once a week she takes it when she gets home because she forgot to in the morning.    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   She is looking for classes for lpn/rn. She has issues with her work schedule figuring out classes.    Hair is short - currently with significant hair extensions- no issues She is always tired- she does not think that this has changed.  No issues with constipation or diarrhea She is still frequently  cold  LMP 4/18. She was good up until 4/20 but then started vomiting. This was the first time in about a year she had vomiting with her period.    3. Pertinent Review of Systems:   Constitutional: The patient feels "tired". The patient seems healthy and active.  Eyes: Vision seems to be good. There are no recognized eye problems. Wears glasses - left them at home. She doesn't think that she needs them.  Neck: There are no recognized problems of the anterior neck.  Heart: There are no recognized heart problems. The ability to play and do other physical activities seems normal.  Lungs: no asthma or wheezing.  Gastrointestinal: Bowel movents seem normal. There are no recognized GI problems. Legs: Muscle mass and strength seem normal. The child can play and perform other physical activities without obvious discomfort. No edema is noted.  Feet: There are no obvious foot problems. No edema is noted. Neurologic: There are no recognized problems with muscle movement and strength, sensation, or coordination. GYN: Menarche 12/15 (age 4). 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  (Patient not taking: Reported on 07/16/2022), Disp: 90 tablet, Rfl: 0  Allergies as of 04/14/2023 - Review Complete 04/14/2023  Allergen Reaction Noted   Penicillins Swelling 10/29/2011     reports that she has never smoked. She has never used smokeless tobacco. She reports current drug use. Drug: Marijuana. She reports that she does not drink alcohol. Pediatric History  Patient Parents   Burrough,Tiffany (Mother)   Other Topics Concern   Not on file  Social History Narrative      Lives with parents, 2 brothers and 1 sister. Older brother not living at home.    Graduated 2022- 23   Enjoys writing and music.               Graduated [employer]. .  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   ROS: There are no other significant problems involving Valerie [name]'s other body systems.   Objective:  Vital Signs:    BP 120/70 (BP Location: Left Arm, Patient Position: Sitting, Cuff Size: Large)   Pulse 72   Wt 126 lb 12.8 oz (57.5 kg)   LMP 04/03/2023 (Exact Date)   BMI 22.75 kg/m   Blood pressure %iles are not available for patients who are 18 years or older.   Ht Readings from Last 3 Encounters:  07/16/22 5' 2.6" (1.59 m) (26 %, Z= -0.65)*  06/06/22 5' 2.21" (1.58 m) (21 %, Z= -0.80)*  05/16/22 5' 2.95" (1.599 m) (30 %, Z= -0.51)*   * Growth percentiles are based on CDC (Girls, 2-20 Years) data.   Wt Readings from Last 3 Encounters:  04/14/23 126 lb 12.8 oz (57.5 kg) (49 %, Z= -0.04)*  01/21/23 132 lb (59.9 kg) (59 %, Z= 0.23)*  11/18/22 124 lb 9.6 oz (56.5 kg) (46 %, Z= -0.10)*   * Growth percentiles are based on CDC (Girls, 2-20 Years) data.   HC Readings from Last 3 Encounters:  No data found for [facility]   Body surface area is 1.59 meters squared.  No height on file for this encounter. 49 %ile (Z= -0.04) based on CDC (Girls, 2-20 Years) weight-for-age data using vitals from 04/14/2023. No head circumference on file  for this encounter.   PHYSICAL EXAM:    Constitutional: The patient appears healthy and well nourished. The patient's height and weight are normal for age.   Weight is decreased 6 pounds since last visit .  Head: The head is normocephalic. Face: The face appears normal. There are no obvious dysmorphic features. Eyes: The eyes appear to be normally formed and spaced. Gaze is conjugate. There is no obvious arcus or proptosis. Moisture appears normal. Ears: The ears are normally placed and appear externally normal. Mouth: The oropharynx and tongue appear normal. Dentition appears to be normal for age. Oral moisture is normal. Neck: The neck appears to be visibly normal.  Lungs: no increased work  of breathing Heart: regular pulses and peripheral perfusion Abdomen: The abdomen appears to be large in size for the patient's age. There is no obvious hepatomegaly, splenomegaly, or other mass effect.  Arms: Muscle size and bulk are normal for age. Scarring on left arm.  Hands: There is no obvious tremor. Phalangeal and metacarpophalangeal joints are normal. Palmar muscles are normal for age. Palmar skin is normal. Palmar moisture is also normal. Legs: Muscles appear normal for age. No edema is present. Feet: Feet are normally formed. Dorsalis pedal pulses are normal. Neurologic: Strength is normal for age in both the upper and lower extremities. Muscle tone is normal. Sensation to touch is normal in both the legs and feet.   Skin: well healed surgical scar anterior neck. Multiple tattoos.   LAB DATA:    Office Visit on 01/21/2023  Component Date Value Ref Range Status   Free T4 01/21/2023 1.0  0.8 - 1.4 ng/dL Final   TSH 16/09/9603 18.18 (H)  mIU/L Final   Comment:            Reference Range .            1-19 Years 0.50-4.30 .                Pregnancy Ranges            First trimester   0.26-2.66            Second trimester  0.55-2.73            Third trimester   0.43-2.91     Lab  Results  Component Value Date   TSH 18.18 (H) 01/21/2023   TSH 71.66 (H) 07/16/2022   TSH 11.18 (H) 05/16/2022   TSH 9.38 (H) 04/15/2022   TSH 11.09 (H) 03/19/2021   TSH 5.45 (H) 09/14/2020   Lab Results  Component Value Date   FREET4 1.0 01/21/2023   FREET4 0.7 (L) 07/16/2022   FREET4 1.3 05/16/2022   FREET4 1.0 04/15/2022   FREET4 1.2 03/19/2021   FREET4 1.1 09/14/2020    Pending    Assessment and Plan:   ASSESSMENT: Valerie [name] is a 22 y.o. AA female with history of difficult to manage Grave's Disease/hyperthyroidism treated with thyroidectomy at age 45 for definitive therapy.     Post Surgical Hypothyroid - clinically euthyroid - Synthroid 100 mcg daily - Has been working on taking more consistently.    Weight decreased again  PLAN:    1. Diagnostic:  Lab Orders         TSH         T4, free      2. Therapeutic: Continue Synthroid 100 mcg daily -  3. Patient education: Discussions as above.  4. Follow-up: Return in about 3 months (around 07/13/2023).    Dessa Phi, MD  Level of Service: Level 3

## 2023-04-15 LAB — T4, FREE: Free T4: 1.3 ng/dL (ref 0.8–1.4)

## 2023-04-15 LAB — TSH: TSH: 9.03 mIU/L — ABNORMAL HIGH

## 2023-06-20 ENCOUNTER — Encounter (INDEPENDENT_AMBULATORY_CARE_PROVIDER_SITE_OTHER): Payer: Self-pay

## 2023-07-14 ENCOUNTER — Encounter (INDEPENDENT_AMBULATORY_CARE_PROVIDER_SITE_OTHER): Payer: Self-pay | Admitting: Pediatric Endocrinology

## 2023-07-14 ENCOUNTER — Ambulatory Visit (INDEPENDENT_AMBULATORY_CARE_PROVIDER_SITE_OTHER): Payer: Medicaid Other | Admitting: Pediatric Endocrinology

## 2023-07-14 DIAGNOSIS — E890 Postprocedural hypothyroidism: Secondary | ICD-10-CM

## 2023-07-14 MED ORDER — LEVOTHYROXINE SODIUM 100 MCG PO TABS: 100.0000 ug | ORAL_TABLET | Freq: Every day | ORAL | 1 refills | Status: AC

## 2023-07-14 NOTE — Progress Notes (Signed)
Subjective:  Patient Name: Valerie [name] Date of Birth: [birth year]  MRN: [id number]  Valerie [name]  presents to the office today for follow-up evaluation and management  of her hypothyroidism following thyroidectomy in January 2013  HISTORY OF PRESENT ILLNESS:   Valerie [name] is a 22 y.o. AA female .  Valerie [name] was accompanied by her girlfriend, [name]   1. Valerie [name] has been followed in this clinic since 03/16/07.  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 At times she was taking up to four 5 mg tablets of methimazole, twice daily. At other times we had to stop the methimazole completely.  Her TSI values ranged from 200-564% of baseline, with normal being less than 120-140. In the spring and summer of 2012 she became hypothyroid for several months and we had to treat her with Synthroid. Her family chose to seek definitive therapy. She had surgery on January 17th 2013 for removal of her thyroid gland at [facility]. Post operatively she did not have any problems with calcium metabolism, no changes in her voice, or trouble swallowing. She did become rapidly and profoundly hypothyroid. She was placed on escalating doses of synthroid trying to get her thyroid hormone levels in range.    2. The patient's last Pediatric endocrine visit was on 04/14/23. In the interim, she has been generally healthy.     She feels that she is taking her medication most days. She reports that in the past 2 weeks she forgot 5 times- but that each time she made it up by taking 2 the next day. Review of dispense history suggests that she is missing more pills than she realizes. She is not using a pill sorter.   She has continued on 100 mcg of LT4 daily.   She has not yet found a new PCP since she aged out of pediatrics.   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   She has applied to do cosmetology at [employer] starting in January.   Hair is short - currently  with significant hair extensions- no issues She has more energy but is still tired a lot  No issues with constipation or diarrhea She feels that her temperature tolerance is now normal.  LMP 7/10  No vomiting with this cycle. She had vomiting with her cycle in April (1st time in about a year). She also had some vomiting in June.   3. Pertinent Review of Systems:   Constitutional: The patient feels "tired". The patient seems healthy and active.  Eyes: Vision seems to be good. There are no recognized eye problems. Wears glasses - left them at home. She doesn't think that she needs them.  Neck: There are no recognized problems of the anterior neck.  Heart: There are no recognized heart problems. The ability to play and do other physical activities seems normal.  Lungs: no asthma or wheezing.  Gastrointestinal: Bowel movents seem normal. There are no recognized GI problems. Legs: Muscle mass and strength seem normal. The child can play and perform other physical activities without obvious discomfort. No edema is noted.  Feet: There are no obvious foot problems. No edema is noted. Neurologic: There are no recognized problems with muscle movement and strength, sensation, or coordination. GYN: Menarche 12/15 (age 77). 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 (Patient not taking: Reported on 07/16/2022), Disp: 90 tablet, Rfl: 0  Allergies as of 07/14/2023 - Review Complete 07/14/2023  Allergen Reaction Noted   Penicillins Swelling 10/29/2011     reports that she has never smoked. She has never used smokeless tobacco. She reports current drug use. Drug: Marijuana. She reports that she does not drink alcohol. Pediatric History  Patient Parents   Follansbee,Tiffany (Mother)   Other Topics Concern   Not on file  Social History Narrative      Lives with parents, 2 brothers and 1 sister. Older brother not living at home.    Graduated 2022- 23   Enjoys writing and music.               Graduated [employer]. .  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   ROS: There are no other significant problems involving Valerie [name]'s other body systems.   Objective:  Vital Signs:    BP 104/60   Pulse 68   Wt 126 lb (57.2 kg)   BMI 22.61 kg/m   Blood pressure %iles are not available for patients who are 18 years or older.   Ht Readings from Last 3 Encounters:  07/16/22 5' 2.6" (1.59 m) (26%, Z= -0.65)*  06/06/22 5' 2.21" (1.58 m) (21%, Z= -0.80)*  05/16/22 5' 2.95" (1.599 m) (30%, Z= -0.51)*   * Growth percentiles are based on CDC (Girls, 2-20 Years) data.   Wt Readings from Last 3 Encounters:  07/14/23 126 lb (57.2 kg) (46%, Z= -0.10)*  04/14/23 126 lb 12.8 oz (57.5 kg) (49%, Z= -0.04)*  01/21/23 132 lb (59.9 kg) (59%, Z= 0.23)*   * Growth percentiles are based on CDC (Girls, 2-20 Years) data.   HC Readings from Last 3 Encounters:  No data found for [facility]   Body surface area is 1.59 meters squared.  No height on file for this encounter. 46 %ile (Z= -0.10) based on CDC (Girls, 2-20 Years)  weight-for-age data using data from 07/14/2023. No head circumference on file for this encounter.   PHYSICAL EXAM:    Constitutional: The patient appears healthy and well nourished. The patient's height and weight are normal for age.   Weight is stable since last visit .  Head: The head is normocephalic. Face: The face appears normal. There are no obvious dysmorphic features. Eyes: The eyes appear to be normally formed and spaced. Gaze is conjugate. There is no obvious arcus or proptosis. Moisture appears normal. Ears: The ears are normally placed and appear externally normal. Mouth: The oropharynx and tongue appear normal. Dentition appears to be normal for age. Oral moisture is normal.  Neck: The neck appears to be visibly normal. Surgical scar well healed  Lungs: no increased work of breathing Heart: regular pulses and peripheral perfusion Abdomen: The abdomen appears to be large in size for the patient's age. There is no obvious hepatomegaly, splenomegaly, or other mass effect.  Arms: Muscle size and bulk are normal for age. Scarring on left arm.  Hands: There is no obvious tremor. Phalangeal and metacarpophalangeal joints are normal. Palmar muscles are normal for age. Palmar skin is normal. Palmar moisture is also normal. Legs: Muscles appear normal for age. No edema is present. Feet: Feet are normally formed. Dorsalis pedal pulses are normal. Neurologic: Strength is normal for age in both the upper and lower extremities. Muscle tone is normal. Sensation to touch is normal in both the legs and feet.   Skin: well healed surgical scar anterior neck. Multiple tattoos.   LAB DATA:    Office Visit on 04/14/2023  Component Date Value Ref Range Status   TSH 04/14/2023 9.03 (H)  mIU/L Final   Comment:            Reference Range .            1-19 Years 0.50-4.30 .                Pregnancy Ranges            First trimester   0.26-2.66            Second trimester  0.55-2.73             Third trimester   0.43-2.91    Free T4 04/14/2023 1.3  0.8 - 1.4 ng/dL Final    Lab Results  Component Value Date   TSH 9.03 (H) 04/14/2023   TSH 18.18 (H) 01/21/2023   TSH 71.66 (H) 07/16/2022   TSH 11.18 (H) 05/16/2022   TSH 9.38 (H) 04/15/2022   TSH 11.09 (H) 03/19/2021   Lab Results  Component Value Date   FREET4 1.3 04/14/2023   FREET4 1.0 01/21/2023   FREET4 0.7 (L) 07/16/2022   FREET4 1.3 05/16/2022   FREET4 1.0 04/15/2022   FREET4 1.2 03/19/2021    Pending    Assessment and Plan:   ASSESSMENT: Valerie [name] is a 22 y.o. AA female with history of difficult to manage Grave's Disease/hyperthyroidism treated with thyroidectomy at age 10 for definitive therapy.     Post Surgical Hypothyroid - clinically euthyroid - Synthroid 100 mcg daily - Has been working on taking more consistently.    Weight is stable   PLAN:    1. Diagnostic:  Lab Orders         TSH         T4, free       2. Therapeutic: Continue Synthroid 100 mcg daily -  3. Patient education: Discussions as above. Also discussed that I will be leaving [facility] this fall. Discussed need for primary care and options near her. She will check with [facility] to see if they are currently accepting Medicaid patients.  4. Follow-up: Return Family medicine.    Dessa Phi, MD  Level of Service: >30 minutes spent today reviewing the medical chart, counseling the patient/family, and documenting today's encounter.

## 2023-08-07 ENCOUNTER — Ambulatory Visit
Admission: EM | Admit: 2023-08-07 | Discharge: 2023-08-07 | Disposition: A | Discharge status: Home / Self Care | Payer: Medicaid Other | Attending: Internal Medicine | Admitting: Internal Medicine

## 2023-08-07 DIAGNOSIS — R630 Anorexia: Secondary | ICD-10-CM | POA: Diagnosis not present

## 2023-08-07 DIAGNOSIS — K920 Hematemesis: Secondary | ICD-10-CM

## 2023-08-07 MED ORDER — ONDANSETRON 4 MG PO TBDP
4.0000 mg | ORAL_TABLET | Freq: Once | ORAL | Status: AC
  Administered 2023-08-07: 4 mg via ORAL

## 2023-08-07 NOTE — ED Triage Notes (Signed)
Pt states she drank some water and then threw it up and it had some blood in it.

## 2023-08-07 NOTE — ED Provider Notes (Signed)
[facility]  Note:  This document was prepared using Conservation officer, historic buildings and may include unintentional dictation errors.  MRN: [id number] DOB: 2001/06/06  Subjective:   Valerie [name] is a 22 y.o. female presenting for an episode of vomiting 2 hours prior to arrival in clinic.  She was concerned because there were spots of blood in the vomit.  Has had decreased appetite and upset stomach as well.  Has been avoiding drinking or eating anything since.  Medical chart does show a significant history, diagnosis of avoidant restrictive food intake disorder.  Patient denies having any history of GI issues.  No history of ulcers, GI bleeding.  No recent antibiotic use, hospitalizations or long distance travel.  Has dietary habits of same.  No diarrhea, bloody stools.  No current facility-administered medications for this encounter.  Current Outpatient Medications:    ibuprofen (ADVIL,MOTRIN) 600 MG tablet, Take 1 tab PO Q6H x 1-2 days then Q6H prn pain, Disp: 30 tablet, Rfl: 0   levothyroxine (SYNTHROID) 100 MCG tablet, Take 1 tablet (100 mcg total) by mouth daily., Disp: 90 tablet, Rfl: 1   norethindrone (AYGESTIN) 5 MG tablet, Take 1 tablet (5 mg total) by mouth daily. 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)    Thyrotoxicosis with diffuse goiter      Past Surgical History:  Procedure Laterality Date   THYROIDECTOMY  2012    Family History  Problem Relation Age of Onset   Thyroid disease Father    Diabetes Maternal Grandfather    Cancer Neg Hx     Social History   Tobacco Use   Smoking status: Never   Smokeless  tobacco: Never  Substance Use Topics   Alcohol use: No   Drug use: Yes    Types: Marijuana    ROS   Objective:   Vitals: BP 109/77 (BP Location: Left Arm)   Pulse (!) 58   Temp 98.9 F (37.2 C) (Oral)   Resp 16   LMP 07/25/2023 (Approximate)   SpO2 97%   Physical Exam Constitutional:      General: She is not in acute distress.    Appearance: Normal appearance. She is well-developed. She is not ill-appearing, toxic-appearing or diaphoretic.  HENT:     Head: Normocephalic and atraumatic.     Nose: Nose normal.     Mouth/Throat:     Mouth: Mucous membranes are moist.  Eyes:     General: No scleral icterus.       Right eye: No discharge.        Left eye: No discharge.     Extraocular Movements: Extraocular movements intact.     Conjunctiva/sclera: Conjunctivae normal.  Cardiovascular:     Rate and Rhythm: Normal rate and regular rhythm.     Heart sounds: Normal heart sounds. No murmur heard.    No friction rub. No gallop.  Pulmonary:     Effort: Pulmonary effort is normal. No respiratory distress.     Breath sounds: No stridor. No wheezing, rhonchi or rales.  Chest:     Chest wall: No tenderness.  Abdominal:  General: Bowel sounds are normal. There is no distension.     Palpations: Abdomen is soft. There is no mass.     Tenderness: There is no abdominal tenderness. There is no right CVA tenderness, left CVA tenderness, guarding or rebound.  Skin:    General: Skin is warm and dry.  Neurological:     General: No focal deficit present.     Mental Status: She is alert and oriented to person, place, and time.  Psychiatric:        Mood and Affect: Mood normal.        Behavior: Behavior normal.        Thought Content: Thought content normal.        Judgment: Judgment normal.     P.o. Zofran 4 mg administered in clinic.  Assessment and Plan :   PDMP not reviewed this encounter.  1. Hematemesis without nausea   2. Decreased appetite    No signs of an acute  abdomen, although gastrointestinal bleed is possible, have low suspicion for this at this stage.  Recommended that she try eating light meals and use Zofran, maintain strict ER precautions.  Counseled patient on potential for adverse effects with medications prescribed/recommended today, ER and return-to-clinic precautions discussed, patient verbalized understanding.    Wallis Bamberg, [place] 08/07/23 1402

## 2023-08-07 NOTE — Discharge Instructions (Addendum)
If you continue to have pain, are unable to eat foods or drink fluids, having more bloody vomit then go to the emergency room.

## 2023-09-13 ENCOUNTER — Other Ambulatory Visit (INDEPENDENT_AMBULATORY_CARE_PROVIDER_SITE_OTHER): Payer: Self-pay | Admitting: Pediatric Endocrinology

## 2023-09-13 DIAGNOSIS — E063 Autoimmune thyroiditis: Secondary | ICD-10-CM

## 2023-10-21 ENCOUNTER — Ambulatory Visit: Payer: Medicaid Other | Admitting: Nurse Practitioner

## 2023-12-20 ENCOUNTER — Other Ambulatory Visit (INDEPENDENT_AMBULATORY_CARE_PROVIDER_SITE_OTHER): Payer: Self-pay | Admitting: Pediatric Endocrinology

## 2023-12-20 DIAGNOSIS — E890 Postprocedural hypothyroidism: Secondary | ICD-10-CM

## 2024-01-01 NOTE — Telephone Encounter (Signed)
Attempted to call, no answer left HIPAA approved message to return call.

## 2024-01-02 NOTE — Telephone Encounter (Signed)
Attempted to call no answer left HIPAA approved message to return call.

## 2024-01-08 NOTE — Telephone Encounter (Signed)
Attempted to call no answer left HIPAA approved message to return call.

## 2024-01-09 ENCOUNTER — Encounter (INDEPENDENT_AMBULATORY_CARE_PROVIDER_SITE_OTHER): Payer: Self-pay

## 2024-01-20 NOTE — Telephone Encounter (Signed)
Letter was sent out 01/09/24, today letter was returned to sender

## 2024-11-25 ENCOUNTER — Ambulatory Visit: Payer: Self-pay | Admitting: Sports Medicine
